# Patient Record
Sex: Male | Born: 1970 | Race: White | Hispanic: No | Marital: Single | Smoking: Never smoker
Health system: Southern US, Community
[De-identification: ages and names within clinical notes are randomized; demographics above are authoritative.]

## PROBLEM LIST (undated history)

## (undated) DIAGNOSIS — J479 Bronchiectasis, uncomplicated: Secondary | ICD-10-CM

## (undated) DIAGNOSIS — C6290 Malignant neoplasm of unspecified testis, unspecified whether descended or undescended: Secondary | ICD-10-CM

## (undated) HISTORY — DX: Bronchiectasis, uncomplicated: J47.9

## (undated) HISTORY — DX: Malignant neoplasm of unspecified testis, unspecified whether descended or undescended: C62.90

## (undated) HISTORY — PX: NASAL SINUS SURGERY: SHX719

---

## 2012-10-19 ENCOUNTER — Encounter: Payer: Self-pay | Admitting: Internal Medicine

## 2012-10-19 ENCOUNTER — Ambulatory Visit (INDEPENDENT_AMBULATORY_CARE_PROVIDER_SITE_OTHER): Payer: Managed Care, Other (non HMO) | Admitting: Internal Medicine

## 2012-10-19 VITALS — BP 118/70 | HR 62 | Temp 98.2°F | Ht 67.0 in | Wt 163.0 lb

## 2012-10-19 DIAGNOSIS — J479 Bronchiectasis, uncomplicated: Secondary | ICD-10-CM

## 2012-10-19 MED ORDER — LEVOFLOXACIN 750 MG PO TABS: 750.0000 mg | ORAL_TABLET | Freq: Every day | ORAL | Status: DC

## 2012-10-19 NOTE — Assessment & Plan Note (Addendum)
Presumably related to previous infections but no records available yet from [place]  Reviewed with pt Bronchiectasis =   you have scarring of your bronchial tubes which means that they don't function perfectly normally and mucus tends to pool in certain areas of your lung which can cause pneumonia and further scarring of your lung and bronchial tubes  Whenever you develop cough congestion take mucinex or mucinex dm > these will help keep the mucus loose and flowing but if your condition worsens you need to seek help immediately preferably here or somewhere inside the [facility] system to compare xrays ( worse = darker or bloody mucus or pain on breathing in)  - for now no reason to change rx but ok to use mucinex prn and stop hypertonic saline as not available in [place] anyway (pt has already searched and we don't typically use if for bronchiectasis).  Needs baseline pft's and cxr on return and review of records when available.

## 2012-10-19 NOTE — Progress Notes (Signed)
  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  10/19/2012 1st pulmonary eval cc cough each am > minimal clear never bloody on zmax 500 tiw and pulmicort /xopenex bid and singulair and no limiting sob at time of initial ov.  No obvious daytime variabilty or assoc   cp or chest tightness, subjective wheeze overt sinus or hb symptoms. No unusual exp hx or h/o childhood pna/ asthma or premature birth to his knowledge.   Sleeping ok without nocturnal  or early am exacerbation  of respiratory  c/o's or need for noct saba. Also denies any obvious fluctuation of symptoms with weather or environmental changes or other aggravating or alleviating factors except as outlined above     Review of Systems  Constitutional: Negative for fever, chills, activity change, appetite change and unexpected weight change.  HENT: Negative for congestion, sore throat, rhinorrhea, sneezing, trouble swallowing, dental problem, voice change and postnasal drip.   Eyes: Negative for visual disturbance.  Respiratory: Positive for cough. Negative for choking and shortness of breath.   Cardiovascular: Negative for chest pain and leg swelling.  Gastrointestinal: Positive for abdominal pain. Negative for nausea and vomiting.  Genitourinary: Negative for difficulty urinating.  Musculoskeletal: Negative for arthralgias.  Skin: Negative for rash.  Psychiatric/Behavioral: Negative for behavioral problems and confusion.       Objective:   Physical Exam  amb wm nad Wt Readings from Last 3 Encounters:  10/19/12 163 lb (73.936 kg)   HEENT: nl dentition, turbinates, and orophanx. Nl external ear canals without  cough reflex   NECK :  without JVD/Nodes/TM/ nl carotid upstrokes bilaterally   LUNGS: no acc muscle use, clear to A and P bilaterally without cough on insp or exp maneuvers   CV:  RRR  no s3 or murmur or increase in P2, no edema   ABD:  soft and nontender with nl excursion in the supine position. No bruits or organomegaly, bowel sounds nl  MS:  warm without deformities, calf tenderness, cyanosis or clubbing  SKIN: warm and dry without lesions    NEURO:  alert, approp, no deficits        Assessment & Plan:

## 2012-10-19 NOTE — Patient Instructions (Addendum)
Continue nebulizer twice daily for now with budesonide and xopenex  Stop saline for now  Use proaire as needed if short of breath up to 4 hours but the less you use it the better it will work.  Levaquin 750 x 5 days if needed if mucus gets nasty  Please schedule a follow up visit in 3 months but call sooner if needed

## 2012-11-01 ENCOUNTER — Telehealth: Payer: Self-pay | Admitting: Internal Medicine

## 2012-11-01 MED ORDER — MOMETASONE FUROATE 50 MCG/ACT NA SUSP: 2.0000 | Freq: Two times a day (BID) | NASAL | Status: DC

## 2012-11-01 NOTE — Telephone Encounter (Signed)
Ok with me x 11 refills

## 2012-11-01 NOTE — Telephone Encounter (Signed)
Pt aware rx called in. Nothing further was needed

## 2012-11-01 NOTE — Telephone Encounter (Signed)
lmomtcb x1 

## 2012-11-01 NOTE — Telephone Encounter (Signed)
Pt returned call and can be reached @ [phone]. Chad Osborne

## 2012-11-01 NOTE — Telephone Encounter (Signed)
Pt requesting a refill on a nasal spray that was originally rxd by [employer] in [place]. Would like to have MW rx this for him so that he may continue to take it. Nasonex 2 spray each nostril BID   (90 day supply)  [facility] [address]  Please advise MW. Thanks.

## 2013-01-18 ENCOUNTER — Ambulatory Visit (INDEPENDENT_AMBULATORY_CARE_PROVIDER_SITE_OTHER)
Admission: RE | Admit: 2013-01-18 | Discharge: 2013-01-18 | Disposition: A | Discharge status: Home / Self Care | Payer: Managed Care, Other (non HMO) | Source: Ambulatory Visit | Attending: Internal Medicine | Admitting: Internal Medicine

## 2013-01-18 ENCOUNTER — Ambulatory Visit (INDEPENDENT_AMBULATORY_CARE_PROVIDER_SITE_OTHER): Payer: Managed Care, Other (non HMO) | Admitting: Internal Medicine

## 2013-01-18 ENCOUNTER — Encounter: Payer: Self-pay | Admitting: Internal Medicine

## 2013-01-18 VITALS — BP 110/72 | HR 60 | Temp 98.4°F | Ht 68.0 in | Wt 165.0 lb

## 2013-01-18 DIAGNOSIS — J479 Bronchiectasis, uncomplicated: Secondary | ICD-10-CM

## 2013-01-18 NOTE — Patient Instructions (Addendum)
Please remember to go to the  x-ray department downstairs for your tests - we will call you with the results when they are available.  Find out when your pneumonia shot was   Please schedule a follow up visit in 3 months but call sooner if needed with PFT's

## 2013-01-18 NOTE — Assessment & Plan Note (Signed)
I had an extended discussion with the patient today lasting 15 to 20 minutes of a 25 minute visit on the following issues:  Doing fine with a very modest treatment plan which includes prn levaquin and mucinex  He had a flutter valve ordered in [place] but never purchased it - will also consider adding more aggressive bronchodilator regimen when returns in 3 months for pfts but for now will stick with the less is more approach and not overtreat what will in all likelihood persist as a lifelong condition  Needs to know when next pneumovax is due, he will check with [place].

## 2013-01-18 NOTE — Progress Notes (Signed)
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   HPI 10/19/2012 1st pulmonary eval cc cough each am > minimal clear never bloody on zmax 500 tiw and pulmicort /xopenex bid and singulair and no limiting sob at time of initial ov. rec Continue nebulizer twice daily for now with budesonide and xopenex Stop saline for now Use proaire as needed if short of breath up to 4 hours but the less you use it the better it will work. Levaquin 750 x 5 days if needed if mucus gets nasty   01/18/2013 f/u ov/Ein Rijo  Chief Complaint  Patient presents with  . 3 Month Follow Up    Breathing is doing well. Reports having to get Levaquin filled. Has been running and excersing more than he has in a while.   one flare of cough with discolored sputum since last ov, resolved p 5 days levaquin and no need for even mucinex at this point.  Very rare need for saba hfa only , never neb  No obvious daytime variabilty or assoc   cp or chest tightness, subjective wheeze overt sinus or hb symptoms. No unusual exp hx or h/o childhood pna/ asthma or premature birth to his knowledge.   Sleeping ok without nocturnal  or early am exacerbation  of respiratory  c/o's or need for noct saba. Also denies any obvious fluctuation of symptoms with weather or environmental changes or other aggravating or alleviating factors except as outlined above    Current Medications, Allergies, Past Medical History, Past Surgical History, Family History, and Social History were reviewed in [employer] record.  ROS  The following are not active complaints unless bolded sore throat,  dysphagia, dental problems, itching, sneezing,  nasal congestion or excess/ purulent secretions, ear ache,   fever, chills, sweats, unintended wt loss, pleuritic or exertional cp, hemoptysis,  orthopnea pnd or leg swelling, presyncope, palpitations, heartburn, abdominal pain, anorexia, nausea, vomiting, diarrhea  or change in bowel or urinary habits, change in stools or urine, dysuria,hematuria,  rash, arthralgias, visual complaints, headache, numbness weakness or ataxia or problems with walking or coordination,  change in mood/affect or memory.             Objective:   Physical Exam  amb wm nad  01/18/2013          165  Wt Readings from Last 3 Encounters:  10/19/12 163 lb (73.936 kg)     HEENT: nl dentition, turbinates, and orophanx. Nl external ear canals without cough reflex   NECK :  without JVD/Nodes/TM/ nl carotid upstrokes bilaterally   LUNGS: no acc muscle use, clear to A and P bilaterally without cough on insp or exp maneuvers   CV:  RRR  no s3 or murmur or increase in P2, no edema   ABD:  soft and nontender with nl excursion in the supine position. No bruits or organomegaly, bowel sounds nl  MS:  warm without deformities, calf tenderness, cyanosis or clubbing  SKIN: warm and dry without lesions    NEURO:  alert, approp, no deficits   CXR  01/18/2013 :   There is a degree  of right lower lobe bronchiectasis. No other bronchiectasis seen My review: tram tracking probably present both lower lobes       Assessment & Plan:

## 2013-01-31 ENCOUNTER — Telehealth: Payer: Self-pay | Admitting: Internal Medicine

## 2013-01-31 NOTE — Telephone Encounter (Signed)
The intent is to use the levaquin x 5 days for flares of discolored mucus, that's why he was given 11 refills.  If he takes it and it doesn't work to change the mucus back to white, then we need to see him to try something different, but as long as it works then he should stick with with short courses

## 2013-01-31 NOTE — Telephone Encounter (Signed)
Spoke with patient-- Patient seen on 10/19/12 and given Levaquin 750 mg #5 w 11 refills for bronchiectasis sx Patient states he was feeling a lot better when he returned for follow up visit 01/18/13 However in the last week patient says his sx have returned Patient is planning on going out of town in which he will spend quite a bit of time outdoors and states he "simply cant feel like this on vacation" Patient would like to know if Dr. Sherene Sires thinks he should pick up another refill of Levaquin or is there something else he could rec for patient Dr. Sherene Sires please advise, thank you!  No Known Allergies

## 2013-01-31 NOTE — Telephone Encounter (Signed)
Pt is aware of MW recs. He states that he is just not feeling well, wants to be seen before the end of the week due to a vacation. Appointment has been made with TP on 02/02/13 at 10:45a, MW did not have anything this week.

## 2013-01-31 NOTE — Telephone Encounter (Signed)
[phone] pt is calling back

## 2013-02-02 ENCOUNTER — Encounter: Payer: Self-pay | Admitting: Adult Health

## 2013-02-02 ENCOUNTER — Ambulatory Visit (INDEPENDENT_AMBULATORY_CARE_PROVIDER_SITE_OTHER): Payer: Managed Care, Other (non HMO) | Admitting: Adult Health

## 2013-02-02 VITALS — BP 118/72 | HR 62 | Temp 97.2°F | Ht 68.0 in | Wt 168.2 lb

## 2013-02-02 DIAGNOSIS — J471 Bronchiectasis with (acute) exacerbation: Secondary | ICD-10-CM

## 2013-02-02 MED ORDER — LEVOFLOXACIN 500 MG PO TABS: 500.0000 mg | ORAL_TABLET | Freq: Every day | ORAL | Status: DC

## 2013-02-02 NOTE — Assessment & Plan Note (Signed)
Slow to resolve flare  Add flutter to regimen.  Trial of probiotic with frequent abx use.   Plan  Levaquin 500mg  daily for 10 days -take with food Hold Zithromax until done with Levaquin .  Would take probiotic daily .  Mucinex  Twice daily  As needed  Cough/congestion .  Saline nasal rinses Twice daily  As needed   Please contact office for sooner follow up if symptoms do not improve or worsen or seek emergency care  If not improving will need to follow up sooner.

## 2013-02-02 NOTE — Patient Instructions (Addendum)
Levaquin 500mg  daily for 10 days -take with food Hold Zithromax until done with Levaquin .  Would take probiotic daily .  Mucinex  Twice daily  As needed  Cough/congestion .  Saline nasal rinses Twice daily  As needed   Please contact office for sooner follow up if symptoms do not improve or worsen or seek emergency care  If not improving will need to follow up sooner.

## 2013-02-02 NOTE — Progress Notes (Signed)
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   HPI 10/19/2012 1st pulmonary eval cc cough each am > minimal clear never bloody on zmax 500 tiw and pulmicort /xopenex bid and singulair and no limiting sob at time of initial ov. rec Continue nebulizer twice daily for now with budesonide and xopenex Stop saline for now Use proaire as needed if short of breath up to 4 hours but the less you use it the better it will work. Levaquin 750 x 5 days if needed if mucus gets nasty   01/18/2013 f/u ov/[place]  Chief Complaint  Patient presents with  . 3 Month Follow Up    Breathing is doing well. Reports having to get Levaquin filled. Has been running and excersing more than he has in a while.   one flare of cough with discolored sputum since last ov, resolved p 5 days levaquin and no need for even mucinex at this point.  Very rare need for saba hfa only , never neb >>CXR with bronchiectasis changes in bases   02/02/2013 Acute OV  Complains of increased SOB, tickle in back of throat, prod cough with thick light yellow mucus x10days.  refilled Levaquin 750mg  1 week ago with no relief-finished 3 days ago. Does have chronic sinus dz with previous sinus surgery.  Has sinus headache/tenderness, pressure and yellow discharge.  Leaving to go on vacation to [place] tomorrow.  No hemoptysis, wt loss, chest pain, orthopnea or edema.  No diarrhea or bloody stools.  Has coughing fits on/off.  Does not have a flutter valve.  Using budesonide and xopenex nebs .  On Zithromax 3 days a week. Has not taken today.    Current Medications, Allergies, Past Medical History, Past  Surgical History, Family History, and Social History were reviewed in [employer] record.  ROS  The following are not active complaints unless bolded sore throat, dysphagia, dental problems, itching, sneezing,  nasal congestion or excess/ purulent secretions, ear ache,   fever, chills, sweats, unintended wt loss, pleuritic or exertional cp, hemoptysis,  orthopnea pnd or leg swelling, presyncope, palpitations, heartburn, abdominal pain, anorexia, nausea, vomiting, diarrhea  or change in bowel or urinary habits, change in stools or urine, dysuria,hematuria,  rash, arthralgias, visual complaints, headache, numbness weakness or ataxia or problems with walking or coordination,  change in mood/affect or memory.             Objective:   Physical Exam  amb wm nad  01/18/2013          165> 168 02/02/2013     HEENT: nl dentition, right sided maxillary tenderness and turbinate edema , and orophanx. Nl external ear canals without cough reflex,    NECK :  without JVD/Nodes/TM/ nl carotid upstrokes bilaterally   LUNGS: no acc muscle use, clear to A and P bilaterally without cough on insp or exp maneuvers   CV:  RRR  no s3 or murmur or increase in P2, no edema   ABD:  soft and nontender with nl excursion in the supine position. No bruits or organomegaly, bowel sounds nl  MS:  warm without deformities,  calf tenderness, cyanosis or clubbing  SKIN: warm and dry without lesions    NEURO:  alert, approp, no deficits   CXR  01/18/2013 :  There is a degree of right lower lobe bronchiectasis. No other bronchiectasis seen My review: tram tracking probably present both lower lobes       Assessment & Plan:

## 2013-02-07 MED ORDER — FLUTTER DEVI: Status: AC

## 2013-02-07 NOTE — Addendum Note (Signed)
Addended by: Boone Master E on: 02/07/2013 10:47 AM   Modules accepted: Orders

## 2013-06-16 ENCOUNTER — Telehealth: Payer: Self-pay | Admitting: Internal Medicine

## 2013-06-16 ENCOUNTER — Encounter (INDEPENDENT_AMBULATORY_CARE_PROVIDER_SITE_OTHER): Payer: Self-pay

## 2013-06-16 ENCOUNTER — Ambulatory Visit (INDEPENDENT_AMBULATORY_CARE_PROVIDER_SITE_OTHER): Payer: Managed Care, Other (non HMO) | Admitting: Internal Medicine

## 2013-06-16 ENCOUNTER — Encounter: Payer: Self-pay | Admitting: Internal Medicine

## 2013-06-16 VITALS — BP 122/80 | HR 92 | Temp 98.0°F | Ht 67.5 in | Wt 170.8 lb

## 2013-06-16 DIAGNOSIS — J479 Bronchiectasis, uncomplicated: Secondary | ICD-10-CM

## 2013-06-16 DIAGNOSIS — Z23 Encounter for immunization: Secondary | ICD-10-CM

## 2013-06-16 MED ORDER — MOMETASONE FURO-FORMOTEROL FUM 100-5 MCG/ACT IN AERO: INHALATION_SPRAY | RESPIRATORY_TRACT | Status: DC

## 2013-06-16 MED ORDER — LEVOFLOXACIN 500 MG PO TABS: 500.0000 mg | ORAL_TABLET | Freq: Every day | ORAL | Status: DC

## 2013-06-16 NOTE — Progress Notes (Signed)
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   HPI 10/19/2012 1st pulmonary eval cc cough each am > minimal clear never bloody on zmax 500 tiw and pulmicort /xopenex bid and singulair and no limiting sob at time of initial ov. rec Continue nebulizer twice daily for now with budesonide and xopenex Stop saline for now Use proaire as needed if short of breath up to 4 hours but the less you use it the better it will work. Levaquin 750 x 5 days if needed if mucus gets nasty   01/18/2013 f/u ov/Opal Dinning  Chief Complaint  Patient presents with  . 3 Month Follow Up    Breathing is doing well. Reports having to get Levaquin filled. Has been running and excersing more than he has in a while.   one flare of cough with discolored sputum since last ov, resolved p 5 days levaquin and no need for even mucinex at this point.  Very rare need for saba hfa only , never neb >>CXR with bronchiectasis changes in bases   02/02/2013 Acute OV  Complains of increased SOB, tickle in back of throat, prod cough with thick light yellow mucus x10days.  refilled Levaquin 750mg  1 week ago with no relief-finished 3 days ago. Does have chronic sinus dz with previous sinus surgery.  Has sinus headache/tenderness, pressure and yellow discharge.  Leaving to go on vacation to [place] tomorrow.  No hemoptysis, wt loss, chest pain, orthopnea or edema.  No diarrhea or bloody stools.  Has coughing fits on/off.  Does not have a flutter valve.  Using budesonide and xopenex nebs .  On Zithromax 3 days a week  rec Levaquin 500mg  daily for 10 days -take with food Hold  Zithromax until done with Levaquin .  Would take probiotic daily .  Mucinex  Twice daily  As needed  Cough/congestion .  Saline nasal rinses Twice daily  As needed     06/16/2013 f/u ov/Buffi Ew[place] re: bronchiectasis f/u  Chief Complaint  Patient presents with  . Follow-up    Pt c/o increased SOB, prod cough with thick, yellow sputum x 2 wks- he is taking his second round of levaquin and syptoms seem to be improving.   had been using levaquin 500 x 10 days which he found more effective than 750 x 5 Using bud/form neb bid with prev problems with thrush with ics/laba per hfa  No obvious day to day or daytime variabilty or assoc  cp or chest tightness, subjective wheeze overt sinus or hb symptoms. No unusual exp hx or h/o childhood pna/ asthma or knowledge of premature birth.  Sleeping ok without nocturnal  or early am exacerbation  of respiratory  c/o's or need for noct saba. Also denies any obvious fluctuation of symptoms with weather or environmental changes or other aggravating or alleviating factors except as outlined above   Current Medications, Allergies, Complete Past Medical History, Past Surgical History, Family History, and Social History were reviewed in [employer] record.  ROS  The following are not active complaints unless bolded sore throat, dysphagia, dental problems, itching, sneezing,  nasal congestion  or excess/ purulent secretions, ear ache,   fever, chills, sweats, unintended wt loss, pleuritic or exertional cp, hemoptysis,  orthopnea pnd or leg swelling, presyncope, palpitations, heartburn, abdominal pain, anorexia, nausea, vomiting, diarrhea  or change in bowel or urinary habits, change in stools or urine, dysuria,hematuria,  rash, arthralgias, visual complaints, headache, numbness weakness or ataxia or problems with walking or coordination,  change in mood/affect or memory.                    Objective:   Physical Exam  amb wm nad  Wt  Readings from Last 3 Encounters:  06/16/13 170 lb 12.8 oz (77.474 kg)  02/02/13 168 lb 3.2 oz (76.295 kg)  01/18/13 165 lb (74.844 kg)        HEENT: nl dentition, no turbinate edema , and nl orophanx. Nl external ear canals without cough reflex,    NECK :  without JVD/Nodes/TM/ nl carotid upstrokes bilaterally   LUNGS: no acc muscle use, clear to A and P bilaterally without cough on insp or exp maneuvers   CV:  RRR  no s3 or murmur or increase in P2, no edema   ABD:  soft and nontender with nl excursion in the supine position. No bruits or organomegaly, bowel sounds nl  MS:  warm without deformities, calf tenderness, cyanosis or clubbing     CXR  01/18/2013 :  There is a degree of right lower lobe bronchiectasis. No other bronchiectasis seen My review: tram tracking probably present both lower lobes       Assessment & Plan:

## 2013-06-16 NOTE — Assessment & Plan Note (Addendum)
DDX of  difficult airways managment all start with A and  include Adherence, Ace Inhibitors, Acid Reflux, Active Sinus Disease, Alpha 1 Antitripsin deficiency, Anxiety masquerading as Airways dz,  ABPA,  allergy(esp in young), Aspiration (esp in elderly), Adverse effects of DPI,  Active smokers, plus two Bs  = Bronchiectasis and Beta blocker use..and one C= CHF  Adherence is always the initial "prime suspect" and is a multilayered concern that requires a "trust but verify" approach in every patient - starting with knowing how to use medications, especially inhalers, correctly, keeping up with refills and understanding the fundamental difference between maintenance and prns vs those medications only taken for a very short course and then stopped and not refilled.    Bronchiectasis discussed > needs prevnar/ ok to do 10 day cycles of levaquin  ? Allergy/ asthma > try dulera 100 2bid  - The proper method of use, as well as anticipated side effects, of a metered-dose inhaler are discussed and demonstrated to the patient. Improved effectiveness after extensive coaching during this visit to a level of approximately  90%   See instructions for specific recommendations which were reviewed directly with the patient who was given a copy with highlighter outlining the key components.   Needs f/u pfts

## 2013-06-16 NOTE — Addendum Note (Signed)
Addended by: Mathis Bud on: 06/16/2013 11:24 AM   Modules accepted: Orders

## 2013-06-16 NOTE — Telephone Encounter (Signed)
10 days is fine even if he has pna - the problem is one of interpretation given his bronchiectasis - since we have a comparison study from 01/2013 rec he finish the levaquin and return here for apples to apples cxr comparison 2 weeks p finishes levaquin but sooner if condition worsens p finishes levaquin

## 2013-06-16 NOTE — Telephone Encounter (Signed)
Called and spoke with pt. He reports on Monday his PCP rx'd levaquin. Pt reports 10 min ago he was called and advised he had PNA. Pt received prevanr today at [facility]. He has 3 days left of levaquin. He wants to make sure he is right on track with tx. Please advise MW thanks

## 2013-06-16 NOTE — Telephone Encounter (Signed)
Called and spoke with pt and made of recs. Nothing further needed and appt scheduled

## 2013-06-16 NOTE — Patient Instructions (Addendum)
Prevnar today   Dulera 100 Take 2 puffs first thing in am and then another 2 puffs about 12 hours later.   Only use your albuterol (proair) as a rescue medication to be used if you can't catch your breath by resting or doing a relaxed purse lip breathing pattern.  - The less you use it, the better it will work when you need it. - Ok to use up to 2 puffs  every 4 hours if you must but call for immediate appointment if use goes up over your usual need - Don't leave home without it !!  (think of it like the spare tire for your car)   Please schedule a follow up office visit in 6 weeks, call sooner if needed with pfts

## 2013-06-30 ENCOUNTER — Ambulatory Visit (INDEPENDENT_AMBULATORY_CARE_PROVIDER_SITE_OTHER): Payer: Managed Care, Other (non HMO) | Admitting: Internal Medicine

## 2013-06-30 ENCOUNTER — Encounter: Payer: Self-pay | Admitting: Internal Medicine

## 2013-06-30 ENCOUNTER — Ambulatory Visit (INDEPENDENT_AMBULATORY_CARE_PROVIDER_SITE_OTHER)
Admission: RE | Admit: 2013-06-30 | Discharge: 2013-06-30 | Disposition: A | Discharge status: Home / Self Care | Payer: Managed Care, Other (non HMO) | Source: Ambulatory Visit | Attending: Internal Medicine | Admitting: Internal Medicine

## 2013-06-30 ENCOUNTER — Other Ambulatory Visit: Payer: Managed Care, Other (non HMO)

## 2013-06-30 VITALS — BP 128/70 | HR 85 | Temp 98.2°F | Ht 68.0 in | Wt 166.2 lb

## 2013-06-30 DIAGNOSIS — J479 Bronchiectasis, uncomplicated: Secondary | ICD-10-CM

## 2013-06-30 NOTE — Progress Notes (Signed)
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   HPI 10/19/2012 1st pulmonary eval cc cough each am > minimal clear never bloody on zmax 500 tiw and pulmicort /xopenex bid and singulair and no limiting sob at time of initial ov. rec Continue nebulizer twice daily for now with budesonide and xopenex Stop saline for now Use proaire as needed if short of breath up to 4 hours but the less you use it the better it will work. Levaquin 750 x 5 days if needed if mucus gets nasty   01/18/2013 f/u ov/Jensen Cheramie  Chief Complaint  Patient presents with  . 3 Month Follow Up    Breathing is doing well. Reports having to get Levaquin filled. Has been running and excersing more than he has in a while.   one flare of cough with discolored sputum since last ov, resolved p 5 days levaquin and no need for even mucinex at this point.  Very rare need for saba hfa only , never neb >>CXR with bronchiectasis changes in bases   02/02/2013 Acute OV  Complains of increased SOB, tickle in back of throat, prod cough with thick light yellow mucus x10days.  refilled Levaquin 750mg  1 week ago with no relief-finished 3 days ago. Does have chronic sinus dz with previous sinus surgery.  Has sinus headache/tenderness, pressure and yellow discharge.  Leaving to go on vacation to [place] tomorrow.  No hemoptysis, wt loss, chest pain, orthopnea or edema.  No diarrhea or bloody stools.  Has coughing fits on/off.  Does not have a flutter valve.  Using budesonide and xopenex nebs .  On Zithromax 3 days a week  rec Levaquin 500mg  daily for 10 days -take with food Hold  Zithromax until done with Levaquin .  Would take probiotic daily .  Mucinex  Twice daily  As needed  Cough/congestion .  Saline nasal rinses Twice daily  As needed     06/16/2013 f/u ov/Yazeed Pryer re: bronchiectasis f/u  Chief Complaint  Patient presents with  . Follow-up    Pt c/o increased SOB, prod cough with thick, yellow sputum x 2 wks- he is taking his second round of levaquin and syptoms seem to be improving.   had been using levaquin 500 x 10 days which he found more effective than 750 x 5 Using bud/form neb bid with prev problems with thrush with ics/laba per hfa rec Prevnar rx  Dulera 100 Take 2 puffs first thing in am and then another 2 puffs about 12 hours later.  Only use your albuterol (proair) as rescue    06/30/2013 f/u ov/Shalisa Mcquade re: bronchiectasis/ ? sinsusitis Chief Complaint  Patient presents with  . Follow-up    Pt states that his cough and SOB is improved since the last visit. He c/o having some sinus pressure and HA since last visit.      Much less need for saba   No obvious day to day or daytime variabilty or assoc  cp or chest tightness, subjective wheeze overt sinus or hb symptoms. No unusual exp hx or h/o childhood pna/ asthma or knowledge of premature birth.  Sleeping ok without nocturnal  or early am exacerbation  of respiratory  c/o's or need for noct saba. Also denies any obvious fluctuation of symptoms with weather or environmental changes or other aggravating or alleviating factors except as outlined above   Current Medications, Allergies, Complete Past Medical History, Past Surgical History, Family History, and Social History were reviewed in [employer] record.  ROS  The following are not active complaints unless bolded sore throat, dysphagia, dental problems, itching, sneezing,  nasal congestion or excess/ purulent secretions, ear ache,   fever, chills, sweats, unintended wt loss, pleuritic or exertional cp, hemoptysis,  orthopnea  pnd or leg swelling, presyncope, palpitations, heartburn, abdominal pain, anorexia, nausea, vomiting, diarrhea  or change in bowel or urinary habits, change in stools or urine, dysuria,hematuria,  rash, arthralgias, visual complaints, headache, numbness weakness or ataxia or problems with walking or coordination,  change in mood/affect or memory.                    Objective:   Physical Exam  amb wm nad  06/30/2013      166 Wt Readings from Last 3 Encounters:  06/16/13 170 lb 12.8 oz (77.474 kg)  02/02/13 168 lb 3.2 oz (76.295 kg)  01/18/13 165 lb (74.844 kg)        HEENT: nl dentition, no turbinate edema , and nl orophanx. Nl external ear canals without cough reflex,    NECK :  without JVD/Nodes/TM/ nl carotid upstrokes bilaterally   LUNGS: no acc muscle use,  Bilateral mid exp bilateral rhonchi   CV:  RRR  no s3 or murmur or increase in P2, no edema   ABD:  soft and nontender with nl excursion in the supine position. No bruits or organomegaly, bowel sounds nl  MS:  warm without deformities, calf tenderness, cyanosis or clubbing     CXR  06/30/2013 : The heart size and mediastinal contours are within normal limits.  Both lungs are clear. No pleural effusion or pneumothorax is noted.  The visualized skeletal structures are unremarkable.  IMPRESSION:  No active cardiopulmonary disease.       Assessment & Plan:

## 2013-06-30 NOTE — Patient Instructions (Signed)
Please see patient coordinator before you leave today  to schedule sinus CT  I emphasized that nasal steroids have no immediate benefit in terms of improving symptoms.  To help them reached the target tissue, the patient should use Afrin two puffs every 12 hours applied one min before using the nasal steroids.  Afrin should be stopped after no more than 5 days.  If the symptoms worsen, Afrin can be restarted after 5 days off of therapy to prevent rebound congestion from overuse of Afrin.  I also emphasized that in no way are nasal steroids a concern in terms of "addiction".   Please remember to go to the lab   department downstairs for your tests - we will call you with the results when they are available.      Reschedule your visit for pft's

## 2013-06-30 NOTE — Assessment & Plan Note (Addendum)
-   Flutter valve 02/02/13  - Prevnar given 06/16/2013  - started dulera 100 2bid 06/16/2013 > -  Alpha one AT 06/30/2013 >>> - PFT's rec for 07/2013 >>>  Adequate control on present rx, reviewed > no change in rx needed  But does need pfts and sinus ct to complete the w/u plus alpha one genotype   Each maintenance medication was reviewed in detail including most importantly the difference between maintenance and as needed and under what circumstances the prns are to be used.  Please see instructions for details which were reviewed in writing and the patient given a copy.

## 2013-07-02 LAB — ALPHA-1-ANTITRYPSIN: A-1 Antitrypsin, Ser: 146 mg/dL (ref 90–200)

## 2013-07-07 ENCOUNTER — Encounter: Payer: Self-pay | Admitting: Internal Medicine

## 2013-07-07 ENCOUNTER — Ambulatory Visit (INDEPENDENT_AMBULATORY_CARE_PROVIDER_SITE_OTHER)
Admission: RE | Admit: 2013-07-07 | Discharge: 2013-07-07 | Disposition: A | Discharge status: Home / Self Care | Payer: Managed Care, Other (non HMO) | Source: Ambulatory Visit | Attending: Internal Medicine | Admitting: Internal Medicine

## 2013-07-07 DIAGNOSIS — J479 Bronchiectasis, uncomplicated: Secondary | ICD-10-CM

## 2013-07-07 LAB — ALPHA-1 ANTITRYPSIN PHENOTYPE: A-1 Antitrypsin: 131 mg/dL (ref 83–199)

## 2013-07-08 ENCOUNTER — Telehealth: Payer: Self-pay | Admitting: Internal Medicine

## 2013-07-08 ENCOUNTER — Encounter: Payer: Self-pay | Admitting: Internal Medicine

## 2013-07-08 MED ORDER — PREDNISONE 10 MG PO TABS: ORAL_TABLET | ORAL | Status: DC

## 2013-07-08 MED ORDER — AMOXICILLIN-POT CLAVULANATE 875-125 MG PO TABS: 1.0000 | ORAL_TABLET | Freq: Two times a day (BID) | ORAL | Status: DC

## 2013-07-08 NOTE — Progress Notes (Signed)
Quick Note:  Spoke with pt and notified of results per Dr. Wert. Pt verbalized understanding and denied any questions.  ______ 

## 2013-07-08 NOTE — Telephone Encounter (Signed)
Augmentin 875 mg take one pill twice daily  X 10 days - take at breakfast and supper with large glass of water.  It would help reduce the usual side effects (diarrhea and yeast infections) if you ate cultured yogurt at lunch.   Prednisone 10 mg take  4 each am x 2 days,   2 each am x 2 days,  1 each am x 2 days and stop  Lots of NS irrigation > to ent if not responding

## 2013-07-08 NOTE — Telephone Encounter (Signed)
Spoke with the pt and notified of his CT Sinus results  He reports having sinus pressure and HA's daily  He wants to know if there is anything else that he can try  Afrin helped x 5 days and will probably wait a couple more days and take this again  He is taking aleve cold and sinus  Please advise thanks!

## 2013-07-08 NOTE — Telephone Encounter (Signed)
Pt aware of recs, RX has been called in

## 2013-07-29 ENCOUNTER — Ambulatory Visit: Payer: Managed Care, Other (non HMO) | Admitting: Internal Medicine

## 2013-08-04 ENCOUNTER — Other Ambulatory Visit: Payer: Self-pay | Admitting: Internal Medicine

## 2013-08-04 DIAGNOSIS — R0602 Shortness of breath: Secondary | ICD-10-CM

## 2013-08-05 ENCOUNTER — Encounter: Payer: Self-pay | Admitting: Internal Medicine

## 2013-08-05 ENCOUNTER — Ambulatory Visit (INDEPENDENT_AMBULATORY_CARE_PROVIDER_SITE_OTHER): Payer: Managed Care, Other (non HMO) | Admitting: Internal Medicine

## 2013-08-05 VITALS — BP 140/90 | HR 85 | Ht 67.0 in | Wt 168.0 lb

## 2013-08-05 DIAGNOSIS — R0602 Shortness of breath: Secondary | ICD-10-CM

## 2013-08-05 DIAGNOSIS — J479 Bronchiectasis, uncomplicated: Secondary | ICD-10-CM

## 2013-08-05 NOTE — Progress Notes (Signed)
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   HPI 10/19/2012 1st pulmonary eval cc cough each am > minimal clear never bloody on zmax 500 tiw and pulmicort /xopenex bid and singulair and no limiting sob at time of initial ov. rec Continue nebulizer twice daily for now with budesonide and xopenex Stop saline for now Use proaire as needed if short of breath up to 4 hours but the less you use it the better it will work. Levaquin 750 x 5 days if needed if mucus gets nasty   01/18/2013 f/u ov/Ardis Lawley  Chief Complaint  Patient presents with  . 3 Month Follow Up    Breathing is doing well. Reports having to get Levaquin filled. Has been running and excersing more than he has in a while.   one flare of cough with discolored sputum since last ov, resolved p 5 days levaquin and no need for even mucinex at this point.  Very rare need for saba hfa only , never neb >>CXR with bronchiectasis changes in bases   02/02/2013 Acute OV  Complains of increased SOB, tickle in back of throat, prod cough with thick light yellow mucus x10days.  refilled Levaquin 750mg  1 week ago with no relief-finished 3 days ago. Does have chronic sinus dz with previous sinus surgery.  Has sinus headache/tenderness, pressure and yellow discharge.  Leaving to go on vacation to [place] tomorrow.  No hemoptysis, wt loss, chest pain, orthopnea or edema.  No diarrhea or bloody stools.  Has coughing fits on/off.  Does not have a flutter valve.  Using budesonide and xopenex nebs .  On Zithromax 3 days a week  rec Levaquin 500mg  daily for 10 days -take with food Hold  Zithromax until done with Levaquin .  Would take probiotic daily .  Mucinex  Twice daily  As needed  Cough/congestion .  Saline nasal rinses Twice daily  As needed     06/16/2013 f/u ov/Amos Micheals re: bronchiectasis f/u  Chief Complaint  Patient presents with  . Follow-up    Pt c/o increased SOB, prod cough with thick, yellow sputum x 2 wks- he is taking his second round of levaquin and syptoms seem to be improving.   had been using levaquin 500 x 10 days which he found more effective than 750 x 5 Using bud/form neb bid with prev problems with thrush with ics/laba per hfa rec Prevnar rx  Dulera 100 Take 2 puffs first thing in am and then another 2 puffs about 12 hours later.  Only use your albuterol (proair) as rescue    06/30/2013 f/u ov/Lindsie Simar re: bronchiectasis/ ? sinsusitis Chief Complaint  Patient presents with  . Follow-up    Pt states that his cough and SOB is improved since the last visit. He c/o having some sinus pressure and HA since last visit.   Much less need for saba rec  schedule sinus CT> chronic changes only rx nasal steroids   08/05/2013 f/u ov/Adasyn Mcadams re: bronchiectasis  Chief Complaint  Patient presents with  . Follow-up    PFT done today-- [facility] working well and no complaints with breatihng  no need for rescue at all, Not limited by breathing from desired activities   Stopped zmax sev weeks prior to OV  No change symptoms   No obvious day to day or daytime variabilty or assoc cough or  cp or chest tightness, subjective wheeze overt sinus or hb symptoms. No unusual exp hx or h/o childhood pna/ asthma or knowledge of premature birth.  Sleeping ok without nocturnal  or early am exacerbation  of respiratory  c/o's or need for noct saba. Also denies any obvious fluctuation of symptoms with weather or environmental changes or other aggravating or alleviating factors except as outlined above   Current Medications, Allergies, Complete Past Medical History, Past Surgical  History, Family History, and Social History were reviewed in [employer] record.  ROS  The following are not active complaints unless bolded sore throat, dysphagia, dental problems, itching, sneezing,  nasal congestion or excess/ purulent secretions, ear ache,   fever, chills, sweats, unintended wt loss, pleuritic or exertional cp, hemoptysis,  orthopnea pnd or leg swelling, presyncope, palpitations, heartburn, abdominal pain, anorexia, nausea, vomiting, diarrhea  or change in bowel or urinary habits, change in stools or urine, dysuria,hematuria,  rash, arthralgias, visual complaints, headache, numbness weakness or ataxia or problems with walking or coordination,  change in mood/affect or memory.                    Objective:   Physical Exam  amb wm nad  06/30/2013      166>  08/05/13  Wt Readings from Last 3 Encounters:  06/16/13 170 lb 12.8 oz (77.474 kg)  02/02/13 168 lb 3.2 oz (76.295 kg)  01/18/13 165 lb (74.844 kg)        HEENT: nl dentition, no turbinate edema , and nl orophanx. Nl external ear canals without cough reflex,    NECK :  without JVD/Nodes/TM/ nl carotid upstrokes bilaterally   LUNGS: no acc muscle use,  Very min exp bilateral rhonchi   CV:  RRR  no s3 or murmur or increase in P2, no edema   ABD:  soft and nontender with nl excursion in the supine position. No bruits or organomegaly, bowel sounds nl  MS:  warm without deformities, calf tenderness, cyanosis or clubbing     CXR  06/30/2013 : The heart size and mediastinal contours are within normal limits.  Both lungs are clear. No pleural effusion or pneumothorax is noted.  The visualized skeletal structures are unremarkable.  IMPRESSION:  No active cardiopulmonary disease.       Assessment & Plan:

## 2013-08-05 NOTE — Patient Instructions (Addendum)
No change in meds or medication routine (except leave off zmax)  Please schedule a follow up visit in 6 months but call sooner if needed

## 2013-08-05 NOTE — Progress Notes (Signed)
PFT done today. 

## 2013-08-05 NOTE — Assessment & Plan Note (Addendum)
-   Flutter valve 02/02/13  - Prevnar given 06/16/2013  - started dulera 100 2bid 06/16/2013 > -  Alpha one AT 06/30/2013 >  MM - Sinus CT rec 06/30/2013 >  Diffuse chronic sinusitis changes  - PFT's 08/05/2013  wnl   Adequate control on present rx, reviewed > no change in rx needed

## 2013-08-19 ENCOUNTER — Other Ambulatory Visit: Payer: Self-pay | Admitting: Internal Medicine

## 2013-09-21 ENCOUNTER — Encounter: Payer: Self-pay | Admitting: Internal Medicine

## 2013-09-21 ENCOUNTER — Ambulatory Visit (INDEPENDENT_AMBULATORY_CARE_PROVIDER_SITE_OTHER): Payer: Managed Care, Other (non HMO) | Admitting: Internal Medicine

## 2013-09-21 ENCOUNTER — Other Ambulatory Visit: Payer: Self-pay | Admitting: Internal Medicine

## 2013-09-21 ENCOUNTER — Ambulatory Visit (INDEPENDENT_AMBULATORY_CARE_PROVIDER_SITE_OTHER)
Admission: RE | Admit: 2013-09-21 | Discharge: 2013-09-21 | Disposition: A | Discharge status: Home / Self Care | Payer: Managed Care, Other (non HMO) | Source: Ambulatory Visit | Attending: Internal Medicine | Admitting: Internal Medicine

## 2013-09-21 VITALS — BP 122/70 | HR 92 | Temp 97.9°F | Ht 67.0 in | Wt 159.0 lb

## 2013-09-21 DIAGNOSIS — J479 Bronchiectasis, uncomplicated: Secondary | ICD-10-CM

## 2013-09-21 DIAGNOSIS — J471 Bronchiectasis with (acute) exacerbation: Secondary | ICD-10-CM

## 2013-09-21 MED ORDER — AMOXICILLIN-POT CLAVULANATE 875-125 MG PO TABS: 1.0000 | ORAL_TABLET | Freq: Two times a day (BID) | ORAL | Status: DC

## 2013-09-21 MED ORDER — AZITHROMYCIN 250 MG PO TABS: ORAL_TABLET | ORAL | Status: DC

## 2013-09-21 MED ORDER — AZITHROMYCIN 500 MG PO TABS: ORAL_TABLET | ORAL | Status: DC

## 2013-09-21 MED ORDER — PREDNISONE 10 MG PO TABS: ORAL_TABLET | ORAL | Status: DC

## 2013-09-21 NOTE — Patient Instructions (Addendum)
zpak Prednisone 10 mg take  4 each am x 2 days,   2 each am x 2 days,  1 each am x 2 days and stop   Once finish zpak, assuming better then start back on zmax 500 three times a week  Please remember to go to the  x-ray department downstairs for your tests - we will call you with the results when they are available.

## 2013-09-21 NOTE — Progress Notes (Signed)
Quick Note:  Spoke with pt and notified of results per Dr. Wert. Pt verbalized understanding and denied any questions.  ______ 

## 2013-09-21 NOTE — Progress Notes (Signed)
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   HPI 10/19/2012 1st pulmonary eval cc cough each am > minimal clear never bloody on zmax 500 tiw and pulmicort /xopenex bid and singulair and no limiting sob at time of initial ov. rec Continue nebulizer twice daily for now with budesonide and xopenex Stop saline for now Use proaire as needed if short of breath up to 4 hours but the less you use it the better it will work. Levaquin 750 x 5 days if needed if mucus gets nasty   01/18/2013 f/u ov/Aveline Daus  Chief Complaint  Patient presents with  . 3 Month Follow Up    Breathing is doing well. Reports having to get Levaquin filled. Has been running and excersing more than he has in a while.   one flare of cough with discolored sputum since last ov, resolved p 5 days levaquin and no need for even mucinex at this point.  Very rare need for saba hfa only , never neb >>CXR with bronchiectasis changes in bases   02/02/2013 Acute OV  Complains of increased SOB, tickle in back of throat, prod cough with thick light yellow mucus x10days.  refilled Levaquin 750mg  1 week ago with no relief-finished 3 days ago. Does have chronic sinus dz with previous sinus surgery.  Has sinus headache/tenderness, pressure and yellow discharge.  Leaving to go on vacation to [place] tomorrow.  No hemoptysis, wt loss, chest pain, orthopnea or edema.  No diarrhea or bloody stools.  Has coughing fits on/off.  Does not have a flutter valve.  Using budesonide and xopenex nebs .  On Zithromax 3 days a week  rec Levaquin 500mg  daily for 10 days -take with food Hold  Zithromax until done with Levaquin .  Would take probiotic daily .  Mucinex  Twice daily  As needed  Cough/congestion .  Saline nasal rinses Twice daily  As needed     06/16/2013 f/u ov/Chad Osborne re: bronchiectasis f/u  Chief Complaint  Patient presents with  . Follow-up    Pt c/o increased SOB, prod cough with thick, yellow sputum x 2 wks- he is taking his second round of levaquin and syptoms seem to be improving.   had been using levaquin 500 x 10 days which he found more effective than 750 x 5 Using bud/form neb bid with prev problems with thrush with ics/laba per hfa rec Prevnar rx  Dulera 100 Take 2 puffs first thing in am and then another 2 puffs about 12 hours later.  Only use your albuterol (proair) as rescue    06/30/2013 f/u ov/Chad Osborne re: bronchiectasis/ ? sinsusitis Chief Complaint  Patient presents with  . Follow-up    Pt states that his cough and SOB is improved since the last visit. He c/o having some sinus pressure and HA since last visit.   Much less need for saba rec  schedule sinus CT> chronic changes only rx nasal steroids   08/05/2013 f/u ov/Chad Osborne re: bronchiectasis  Chief Complaint  Patient presents with  . Follow-up    PFT done today-- [facility] working well and no complaints with breatihng  no need for rescue at all, Not limited by breathing from desired activities   Stopped zmax sev weeks prior to OV  No change symptoms rec No change in meds or medication routine (except leave off zmax)   09/21/2013 acute  ov/Chad Osborne re: acute flare of bronchiectasis Chief Complaint  Patient presents with  . Acute Visit    Pt c/o increased SOB for the past day- at onset had fever and body aches. He also c/o non prod cough. He states has had 2 rounds of levaquin since last visit.  He is using rescue inhaler approx twice per day for the past 2 wks.   w/in a week of last week used levaquin x 10 days then x 10 days ended Sep 16 2013 and flew home from [place] 09/17/13 then 09/20/13 started  new aching /tight but non prod cough,  Fever to 101   No obvious day to day or daytime variabilty or   cp or chest tightness, subjective wheeze overt sinus or hb symptoms. No unusual exp hx or h/o childhood pna/ asthma or knowledge of premature birth.  Sleeping ok without nocturnal  or early am exacerbation  of respiratory  c/o's or need for noct saba. Also denies any obvious fluctuation of symptoms with weather or environmental changes or other aggravating or alleviating factors except as outlined above   Current Medications, Allergies, Complete Past Medical History, Past Surgical History, Family History, and Social History were reviewed in [employer] record.  ROS  The following are not active complaints unless bolded sore throat, dysphagia, dental problems, itching, sneezing,  nasal congestion or excess/ purulent secretions, ear ache,   fever, chills, sweats, unintended wt loss, pleuritic or exertional cp, hemoptysis,  orthopnea pnd or leg swelling, presyncope, palpitations, heartburn, abdominal pain, anorexia, nausea, vomiting, diarrhea  or change in bowel or urinary habits, change in stools or urine, dysuria,hematuria,  rash, arthralgias, visual complaints, headache, numbness weakness or ataxia or problems with walking or coordination,  change in mood/affect or memory.                    Objective:   Physical Exam  amb wm nad  06/30/2013      166>  08/05/13  168 > 09/21/2013 159 Wt Readings from Last 3 Encounters:  06/16/13 170 lb 12.8 oz (77.474 kg)  02/02/13 168 lb 3.2 oz (76.295 kg)  01/18/13 165 lb (74.844 kg)         HEENT: nl dentition, no turbinate edema , and nl orophanx. Nl external ear canals without cough reflex,    NECK :  without JVD/Nodes/TM/ nl carotid upstrokes bilaterally   LUNGS: no acc muscle use,  Very min exp bilateral rhonchi   CV:  RRR  no s3 or murmur or increase in P2, no edema   ABD:  soft and nontender with nl excursion in  the supine position. No bruits or organomegaly, bowel sounds nl  MS:  warm without deformities, calf tenderness, cyanosis or clubbing     CXR  09/21/2013 :   Left lower lobe infiltrate.      Assessment & Plan:

## 2013-09-22 NOTE — Assessment & Plan Note (Addendum)
-   Flutter valve 02/02/13  - Prevnar given 06/16/2013  - started dulera 100 2bid 06/16/2013 > -  Alpha one AT 06/30/2013 >  MM - Sinus CT rec 06/30/2013 >  Diffuse chronic sinusitis changes  - PFT's 08/05/2013  wnl   Since just took 2 courses of levaquin and only short reprieve rec zpak/Augmentin 875 mg take one pill twice daily  X 10 days -

## 2013-09-22 NOTE — Assessment & Plan Note (Signed)
Will resume zmax 500 tiw after present rx complete

## 2013-09-23 LAB — PULMONARY FUNCTION TEST
DL/VA % PRED: 99 %
DL/VA: 4.47 ml/min/mmHg/L
DLCO UNC % PRED: 110 %
DLCO unc: 31.21 ml/min/mmHg
FEF 25-75 PRE: 2.39 L/s
FEF 25-75 Post: 2.65 L/sec
FEF2575-%CHANGE-POST: 10 %
FEF2575-%PRED-POST: 73 %
FEF2575-%PRED-PRE: 66 %
FEV1-%CHANGE-POST: 2 %
FEV1-%PRED-PRE: 83 %
FEV1-%Pred-Post: 86 %
FEV1-PRE: 3.18 L
FEV1-Post: 3.28 L
FEV1FVC-%Change-Post: 0 %
FEV1FVC-%PRED-PRE: 93 %
FEV6-%CHANGE-POST: 2 %
FEV6-%PRED-POST: 95 %
FEV6-%Pred-Pre: 92 %
FEV6-POST: 4.42 L
FEV6-Pre: 4.3 L
FEV6FVC-%CHANGE-POST: 0 %
FEV6FVC-%PRED-PRE: 103 %
FEV6FVC-%Pred-Post: 102 %
FVC-%CHANGE-POST: 3 %
FVC-%PRED-POST: 92 %
FVC-%PRED-PRE: 90 %
FVC-POST: 4.43 L
FVC-Pre: 4.3 L
POST FEV1/FVC RATIO: 74 %
Post FEV6/FVC ratio: 100 %
Pre FEV1/FVC ratio: 74 %
Pre FEV6/FVC Ratio: 100 %
RV % pred: 53 %
RV: 0.91 L
TLC % PRED: 97 %
TLC: 6.18 L

## 2014-02-17 ENCOUNTER — Ambulatory Visit (INDEPENDENT_AMBULATORY_CARE_PROVIDER_SITE_OTHER): Payer: Managed Care, Other (non HMO) | Admitting: Internal Medicine

## 2014-02-17 ENCOUNTER — Encounter (INDEPENDENT_AMBULATORY_CARE_PROVIDER_SITE_OTHER): Payer: Self-pay

## 2014-02-17 ENCOUNTER — Encounter: Payer: Self-pay | Admitting: Internal Medicine

## 2014-02-17 VITALS — BP 120/70 | HR 107 | Temp 98.4°F | Ht 67.5 in | Wt 164.6 lb

## 2014-02-17 DIAGNOSIS — J471 Bronchiectasis with (acute) exacerbation: Secondary | ICD-10-CM

## 2014-02-17 MED ORDER — PREDNISONE 10 MG PO TABS: ORAL_TABLET | ORAL | Status: DC

## 2014-02-17 MED ORDER — AMOXICILLIN-POT CLAVULANATE 875-125 MG PO TABS: 1.0000 | ORAL_TABLET | Freq: Two times a day (BID) | ORAL | Status: DC

## 2014-02-17 NOTE — Patient Instructions (Addendum)
Augmentin 875 mg take one pill twice daily  X 10 days - take at breakfast and supper with large glass of water.  It would help reduce the usual side effects (diarrhea and yeast infections) if you ate cultured yogurt at lunch.   Prednisone 10 mg take  4 each am x 2 days,   2 each am x 2 days,  1 each am x 2 days and stop   Try mucinex d as needed   Please schedule a follow up visit in 3 months but call sooner if needed with cxr on return

## 2014-02-17 NOTE — Progress Notes (Signed)
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   HPI 10/19/2012 1st pulmonary eval cc cough each am > minimal clear never bloody on zmax 500 tiw and pulmicort /xopenex bid and singulair and no limiting sob at time of initial ov. rec Continue nebulizer twice daily for now with budesonide and xopenex Stop saline for now Use proaire as needed if short of breath up to 4 hours but the less you use it the better it will work. Levaquin 750 x 5 days if needed if mucus gets nasty   01/18/2013 f/u ov/Vernecia Umble  Chief Complaint  Patient presents with  . 3 Month Follow Up    Breathing is doing well. Reports having to get Levaquin filled. Has been running and excersing more than he has in a while.   one flare of cough with discolored sputum since last ov, resolved p 5 days levaquin and no need for even mucinex at this point.  Very rare need for saba hfa only , never neb >>CXR with bronchiectasis changes in bases   02/02/2013 Acute OV  Complains of increased SOB, tickle in back of throat, prod cough with thick light yellow mucus x10days.  refilled Levaquin 750mg  1 week ago with no relief-finished 3 days ago. Does have chronic sinus dz with previous sinus surgery.  Has sinus headache/tenderness, pressure and yellow discharge.  Leaving to go on vacation to [place] tomorrow.  No hemoptysis, wt loss, chest pain, orthopnea or edema.  No diarrhea or bloody stools.  Has coughing fits on/off.  Does not have a flutter valve.  Using budesonide and xopenex nebs .  On Zithromax 3 days a week  rec Levaquin 500mg  daily for 10 days -take with food Hold  Zithromax until done with Levaquin .  Would take probiotic daily .  Mucinex  Twice daily  As needed  Cough/congestion .  Saline nasal rinses Twice daily  As needed     06/16/2013 f/u ov/Chad Osborne re: bronchiectasis f/u  Chief Complaint  Patient presents with  . Follow-up    Pt c/o increased SOB, prod cough with thick, yellow sputum x 2 wks- he is taking his second round of levaquin and syptoms seem to be improving.   had been using levaquin 500 x 10 days which he found more effective than 750 x 5 Using bud/form neb bid with prev problems with thrush with ics/laba per hfa rec Prevnar rx  Dulera 100 Take 2 puffs first thing in am and then another 2 puffs about 12 hours later.  Only use your albuterol (proair) as rescue    06/30/2013 f/u ov/[name] re: bronchiectasis/ ? sinsusitis Chief Complaint  Patient presents with  . Follow-up    Pt states that his cough and SOB is improved since the last visit. He c/o having some sinus pressure and HA since last visit.   Much less need for saba rec  schedule sinus CT> chronic changes only rx nasal steroids   08/05/2013 f/u ov/Chad Osborne re: bronchiectasis  Chief Complaint  Patient presents with  . Follow-up    PFT done today-- [facility] working well and no complaints with breatihng  no need for rescue at all, Not limited by breathing from desired activities   Stopped zmax sev weeks prior to OV  No change symptoms rec No change in meds or medication routine (except leave off zmax)   09/21/2013 acute  ov/[name] re: acute flare of bronchiectasis Chief Complaint  Patient presents with  . Acute Visit    Pt c/o increased SOB for the past day- at onset had fever and body aches. He also c/o non prod cough. He states has had 2 rounds of levaquin since last visit.  He is using rescue inhaler approx twice per day for the past 2 wks.   w/in a week of last week used levaquin x 10 days then x 10 days ended Sep 16 2013 and flew home from [place] 09/17/13 then 09/20/13 started  new aching /tight but non prod cough,  Fever to 101 rec zpak Prednisone 10 mg take  4 each am x 2 days,   2 each am x 2 days,  1 each am x 2 days and stop  Once finish zpak, assuming better then start back on zmax 500 three times a week> stopped    02/17/2014 f/u ov/Chad Osborne re: ? Bronchiectasis flare  Chief Complaint  Patient presents with  . Acute Visit    Pt c/o increased SOB, cough and aches- started last night.   already took levaquin 500  x 10 days finished 02/10/14  and felt better but then worse again 24 h prior to OV with lots of nasal congestion but no purulent secretions     No obvious day to day or daytime variabilty or   cp or chest tightness, subjective wheeze overt sinus or hb symptoms. No unusual exp hx or h/o childhood pna/ asthma or knowledge of premature birth.  Sleeping ok without nocturnal  or early am exacerbation  of respiratory  c/o's or need for noct saba. Also denies any obvious fluctuation of symptoms with weather or environmental changes or other aggravating or alleviating factors except as outlined above   Current Medications, Allergies, Complete Past Medical History, Past Surgical History, Family History, and Social History were reviewed in [employer] record.  ROS  The following are not active complaints unless bolded sore throat, dysphagia, dental problems, itching, sneezing,  nasal congestion or excess/ purulent secretions, ear ache,   fever, chills, sweats, unintended wt loss, pleuritic or exertional cp, hemoptysis,  orthopnea pnd or leg swelling, presyncope, palpitations, heartburn, abdominal pain, anorexia, nausea, vomiting, diarrhea  or change in bowel or urinary habits, change in stools or urine, dysuria,hematuria,  rash, arthralgias, visual complaints, headache, numbness weakness or ataxia or problems with walking or coordination,  change in mood/affect or memory.                    Objective:   Physical Exam  amb wm  nad  06/30/2013      166>  08/05/13  168 > 09/21/2013 159> 02/17/2014  165  Wt Readings from Last 3 Encounters:  06/16/13 170 lb 12.8 oz (77.474 kg)  02/02/13 168 lb 3.2 oz (76.295 kg)  01/18/13 165 lb (74.844 kg)         HEENT: nl dentition, no turbinate edema , and nl orophanx. Nl external ear canals without cough reflex,    NECK :  without JVD/Nodes/TM/ nl carotid upstrokes bilaterally   LUNGS: no acc muscle use,  Very min exp bilateral rhonchi   CV:  RRR  no s3 or murmur  or increase in P2, no edema   ABD:  soft and nontender with nl excursion in the supine position. No bruits or organomegaly, bowel sounds nl  MS:  warm without deformities, calf tenderness, cyanosis or clubbing     CXR  09/21/2013 :   Left lower lobe infiltrate.      Assessment & Plan:

## 2014-02-17 NOTE — Assessment & Plan Note (Signed)
-   Flutter valve 02/02/13  - Prevnar given 06/16/2013  - started dulera 100 2bid 06/16/2013 > -  Alpha one AT 06/30/2013 >  MM - Sinus CT rec 06/30/2013 >  Diffuse chronic sinusitis changes  - PFT's 08/05/2013  wnl  - resumed tiw zmax 500 09/29/13 > d/c ? When by pt   DDX of  difficult airways management all start with A and  include Adherence, Ace Inhibitors, Acid Reflux, Active Sinus Disease, Alpha 1 Antitripsin deficiency, Anxiety masquerading as Airways dz,  ABPA,  allergy(esp in young), Aspiration (esp in elderly), Adverse effects of DPI,  Active smokers, plus two Bs  = Bronchiectasis and Beta blocker use..and one C= CHF  Adherence is always the initial "prime suspect" and is a multilayered concern that requires a "trust but verify" approach in every patient - starting with knowing how to use medications, especially inhalers, correctly, keeping up with refills and understanding the fundamental difference between maintenance and prns vs those medications only taken for a very short course and then stopped and not refilled.   ? Active sinus dz > Augmentin 875 mg take one pill twice daily  X 10 days - take at breakfast and supper with large glass of water.  It would help reduce the usual side effects (diarrhea and yeast infections) if you ate cultured yogurt at lunch.  ? Allergic/ asthmatic component > Prednisone 10 mg take  4 each am x 2 days,   2 each am x 2 days,  1 each am x 2 days and stop   Bronchiectasis actually appears well controlled on present rx >   Each maintenance medication was reviewed in detail including most importantly the difference between maintenance and as needed and under what circumstances the prns are to be used.  Please see instructions for details which were reviewed in writing and the patient given a copy.

## 2014-02-25 ENCOUNTER — Other Ambulatory Visit: Payer: Self-pay | Admitting: Internal Medicine

## 2014-03-03 ENCOUNTER — Telehealth: Payer: Self-pay | Admitting: Internal Medicine

## 2014-03-03 MED ORDER — AMOXICILLIN-POT CLAVULANATE 875-125 MG PO TABS: 1.0000 | ORAL_TABLET | Freq: Two times a day (BID) | ORAL | Status: DC

## 2014-03-03 NOTE — Telephone Encounter (Signed)
Spoke with the pt  He states that he had improved after last visit 02/17/14- had taken augmentin 875 x 10 days  He states last night started running low grade temp and having prod cough with brown/yellow sputum  He also states that his chest feels tight  "everything is just like the last 2 times"  I advised needs ov and he declined, stating he is currently on his way to [place]  MW, please advise, thanks!

## 2014-03-03 NOTE — Telephone Encounter (Signed)
Spoke with the pt and notified of recs per MW  He verbalized understanding  Rx was sent to pharm in [place]  He will call for ov when he returns

## 2014-03-03 NOTE — Telephone Encounter (Signed)
Ok  To do the augmentin x 10day then ov bfore any more phone medicine

## 2014-03-17 ENCOUNTER — Telehealth: Payer: Self-pay | Admitting: Internal Medicine

## 2014-03-17 NOTE — Telephone Encounter (Signed)
Pt aware of recs. He is aware to seek emergency care if worsens over the weekend. Nothing further needed

## 2014-03-17 NOTE — Telephone Encounter (Signed)
Spoke with the pt  He states throat started feeling scratchy last night and then started to cough  He states also having cough- prod with minimal sputum- ? Color He states not SOB "but feels pressure around lungs" Had low grade temp last night but none today  We had called in 10 day course of augmentin for him on 03/03/14  I have scheduled him for appt with MW on 03/20/14  Will forward to TP to provide recs in the meantime Please advise thanks!

## 2014-03-17 NOTE — Telephone Encounter (Signed)
Use mucinex , fluids and tylenol  If sick again will need to be seen before any more abx given as 2 courses in last 3 weeks  Will need to be seen  If worse sooner ov , urgent care or ER   Please contact office for sooner follow up if symptoms do not improve or worsen or seek emergency care

## 2014-03-20 ENCOUNTER — Ambulatory Visit: Payer: Managed Care, Other (non HMO) | Admitting: Internal Medicine

## 2014-03-20 ENCOUNTER — Ambulatory Visit (INDEPENDENT_AMBULATORY_CARE_PROVIDER_SITE_OTHER): Payer: Managed Care, Other (non HMO) | Admitting: Internal Medicine

## 2014-03-20 ENCOUNTER — Encounter: Payer: Self-pay | Admitting: Internal Medicine

## 2014-03-20 ENCOUNTER — Ambulatory Visit (INDEPENDENT_AMBULATORY_CARE_PROVIDER_SITE_OTHER)
Admission: RE | Admit: 2014-03-20 | Discharge: 2014-03-20 | Disposition: A | Discharge status: Home / Self Care | Payer: Managed Care, Other (non HMO) | Source: Ambulatory Visit | Attending: Internal Medicine | Admitting: Internal Medicine

## 2014-03-20 DIAGNOSIS — J471 Bronchiectasis with (acute) exacerbation: Secondary | ICD-10-CM

## 2014-03-20 MED ORDER — AZITHROMYCIN 500 MG PO TABS: ORAL_TABLET | ORAL | Status: DC

## 2014-03-20 MED ORDER — AMOXICILLIN-POT CLAVULANATE 875-125 MG PO TABS: 1.0000 | ORAL_TABLET | Freq: Two times a day (BID) | ORAL | Status: DC

## 2014-03-20 NOTE — Progress Notes (Signed)
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   HPI 10/19/2012 1st pulmonary eval cc cough each am > minimal clear never bloody on zmax 500 tiw and pulmicort /xopenex bid and singulair and no limiting sob at time of initial ov. rec Continue nebulizer twice daily for now with budesonide and xopenex Stop saline for now Use proaire as needed if short of breath up to 4 hours but the less you use it the better it will work. Levaquin 750 x 5 days if needed if mucus gets nasty   01/18/2013 f/u ov/Chad Osborne  Chief Complaint  Patient presents with  . 3 Month Follow Up    Breathing is doing well. Reports having to get Levaquin filled. Has been running and excersing more than he has in a while.   one flare of cough with discolored sputum since last ov, resolved p 5 days levaquin and no need for even mucinex at this point.  Very rare need for saba hfa only , never neb >>CXR with bronchiectasis changes in bases   02/02/2013 Acute OV  Complains of increased SOB, tickle in back of throat, prod cough with thick light yellow mucus x10days.  refilled Levaquin 750mg  1 week ago with no relief-finished 3 days ago. Does have chronic sinus dz with previous sinus surgery.  Has sinus headache/tenderness, pressure and yellow discharge.  Leaving to go on vacation to [place] tomorrow.  No hemoptysis, wt loss, chest pain, orthopnea or edema.  No diarrhea or bloody stools.  Has coughing fits on/off.  Does not have a flutter valve.  Using budesonide and xopenex nebs .  On Zithromax 3 days a week  rec Levaquin 500mg  daily for 10 days -take with food Hold  Zithromax until done with Levaquin .  Would take probiotic daily .  Mucinex  Twice daily  As needed  Cough/congestion .  Saline nasal rinses Twice daily  As needed     06/16/2013 f/u ov/Chad Osborne re: bronchiectasis f/u  Chief Complaint  Patient presents with  . Follow-up    Pt c/o increased SOB, prod cough with thick, yellow sputum x 2 wks- he is taking his second round of levaquin and syptoms seem to be improving.   had been using levaquin 500 x 10 days which he found more effective than 750 x 5 Using bud/form neb bid with prev problems with thrush with ics/laba per hfa rec Prevnar rx  Dulera 100 Take 2 puffs first thing in am and then another 2 puffs about 12 hours later.  Only use your albuterol (proair) as rescue    06/30/2013 f/u ov/Chad Osborne re: bronchiectasis/ ? sinsusitis Chief Complaint  Patient presents with  . Follow-up    Pt states that his cough and SOB is improved since the last visit. He c/o having some sinus pressure and HA since last visit.   Much less need for saba rec  schedule sinus CT> chronic changes only rx nasal steroids   08/05/2013 f/u ov/Chad Osborne re: bronchiectasis  Chief Complaint  Patient presents with  . Follow-up    PFT done today-- [facility] working well and no complaints with breatihng  no need for rescue at all, Not limited by breathing from desired activities   Stopped zmax sev weeks prior to OV  No change symptoms rec No change in meds or medication routine (except leave off zmax)   09/21/2013 acute  ov/Chad Osborne re: acute flare of bronchiectasis Chief Complaint  Patient presents with  . Acute Visit    Pt c/o increased SOB for the past day- at onset had fever and body aches. He also c/o non prod cough. He states has had 2 rounds of levaquin since last visit.  He is using rescue inhaler approx twice per day for the past 2 wks.   w/in a week of last week used levaquin x 10 days then x 10 days ended Sep 16 2013 and flew home from [place] 09/17/13 then 09/20/13 started  new aching /tight but non prod cough,  Fever to 101 rec zpak Prednisone 10 mg take  4 each am x 2 days,   2 each am x 2 days,  1 each am x 2 days and stop  Once finish zpak, assuming better then start back on zmax 500 three times a week> stopped    02/17/2014 f/u ov/Chad Osborne re: ? Bronchiectasis flare  Chief Complaint  Patient presents with  . Acute Visit    Pt c/o increased SOB, cough and aches- started last night.   already took levaquin 500  x 10 days finished 02/10/14  and felt better but then worse again 24 h prior to OV with lots of nasal congestion but no purulent secretions  rec Augmentin 875 mg take one pill twice daily  X 10 days - take at breakfast and supper with large glass of water.  It would help reduce the usual side effects (diarrhea and yeast infections) if you ate cultured yogurt at lunch.  Prednisone 10 mg take  4 each am x 2 days,   2 each am x 2 days,  1 each am x 2 days and stop  Try mucinex d as needed   03/03/14 augmentin x 10 days     03/20/2014 f/u ov/Chad Osborne re: persistent cough/ bronchiectasis Chief Complaint  Patient presents with  . Acute Visit    Pt c/o increased cough and chest pressure x 5 days. He c/o "horrible sinus headache"- since this am. He states has noticed thick, brown nasal discharge.    he had responded well to augmentin x 2 cycles then abruptly ill with onset of sinus congestion first, not following up with ent or using nasal hygiene (netipot) chronically or with excac.  No response to mucinex d    No obvious day to day or daytime variabilty or sob  cp or chest tightness, subjective wheeze overt sinus or hb symptoms. No unusual exp hx or h/o childhood pna/ asthma or knowledge of premature birth.  Sleeping ok without nocturnal  or early am exacerbation  of respiratory  c/o's or need for noct saba. Also denies any obvious fluctuation of symptoms with weather or environmental changes or other aggravating or alleviating factors except as outlined above    Current Medications, Allergies, Complete Past Medical History, Past Surgical History, Family History, and Social History were reviewed in [employer] record.  ROS  The following are not active complaints unless bolded sore throat, dysphagia, dental problems, itching, sneezing,  nasal congestion or excess/ purulent secretions, ear ache,   fever, chills, sweats, unintended wt loss, pleuritic or exertional cp, hemoptysis,  orthopnea pnd or leg swelling, presyncope, palpitations, heartburn,  abdominal pain, anorexia, nausea, vomiting, diarrhea  or change in bowel or urinary habits, change in stools or urine, dysuria,hematuria,  rash, arthralgias, visual complaints, headache, numbness weakness or ataxia or problems with walking or coordination,  change in mood/affect or memory.                    Objective:   Physical Exam  amb wm nad  06/30/2013      166>  08/05/13  168 > 09/21/2013 159> 02/17/2014  165 > 03/20/2014 160  Wt Readings from Last 3 Encounters:  06/16/13 170 lb 12.8 oz (77.474 kg)  02/02/13 168 lb 3.2 oz (76.295 kg)  01/18/13 165 lb (74.844 kg)         HEENT: nl dentition,  and nl orophanx. Nl external ear canals without cough reflex, Mucopurulent secretions on r with turbinate swelling bilaterally     NECK :  without JVD/Nodes/TM/ nl carotid upstrokes bilaterally   LUNGS: no acc muscle use,    bilateral mid exp rhonchi   CV:  RRR  no s3 or murmur or increase in P2, no edema   ABD:  soft and nontender with nl excursion in the supine position. No bruits or organomegaly, bowel sounds nl  MS:  warm without deformities, calf tenderness, cyanosis or clubbing      CXR  03/20/2014 : Bibasilar atelectasis and/or scarring with chronic left lower lobe infiltrate and probable bronchiectasis . No acute abnormality .     Assessment & Plan:

## 2014-03-20 NOTE — Patient Instructions (Addendum)
zithromax 500 three times a week indefinitely  augmentin x 21 days See Dr Redmond Baseman before you finish the Augmentin  Please remember to go to the  x-ray department downstairs for your tests - we will call you with the results when they are available.  Please schedule a follow up visit in 3 months but call sooner if needed

## 2014-03-21 NOTE — Progress Notes (Signed)
Quick Note:  Spoke with pt and notified of results per Dr. Wert. Pt verbalized understanding and denied any questions.  ______ 

## 2014-03-21 NOTE — Assessment & Plan Note (Addendum)
DDX of  difficult airways management all start with A and  include Adherence, Ace Inhibitors, Acid Reflux, Active Sinus Disease, Alpha 1 Antitripsin deficiency, Anxiety masquerading as Airways dz,  ABPA,  allergy(esp in young), Aspiration (esp in elderly), Adverse effects of DPI,  Active smokers, plus two Bs  = Bronchiectasis (which he has) and Beta blocker use..and one C= CHF   Adherence is always the initial "prime suspect" and is a multilayered concern that requires a "trust but verify" approach in every patient - starting with knowing how to use medications, especially inhalers, correctly, keeping up with refills and understanding the fundamental difference between maintenance and prns vs those medications only taken for a very short course and then stopped and not refilled.   ? Acute / chronic sinusitis may be a factor here > rx x augmentin x 21 day and ent/ resume nettipot  ? Allergic component > doubt it, hold further steroids and continue singulair and dulera 100 2bid   Bronchiectasis > resume zmax 500 tiw at this point as did better historically on it    Each maintenance medication was reviewed in detail including most importantly the difference between maintenance and as needed and under what circumstances the prns are to be used.  Please see instructions for details which were reviewed in writing and the patient given a copy.

## 2014-05-26 ENCOUNTER — Other Ambulatory Visit: Payer: Self-pay | Admitting: Internal Medicine

## 2014-06-20 ENCOUNTER — Ambulatory Visit: Payer: Managed Care, Other (non HMO) | Admitting: Internal Medicine

## 2014-06-27 ENCOUNTER — Ambulatory Visit (INDEPENDENT_AMBULATORY_CARE_PROVIDER_SITE_OTHER): Payer: Managed Care, Other (non HMO) | Admitting: Internal Medicine

## 2014-06-27 ENCOUNTER — Encounter: Payer: Self-pay | Admitting: Internal Medicine

## 2014-06-27 VITALS — BP 112/70 | HR 66 | Ht 67.5 in | Wt 152.0 lb

## 2014-06-27 DIAGNOSIS — J479 Bronchiectasis, uncomplicated: Secondary | ICD-10-CM

## 2014-06-27 DIAGNOSIS — Z23 Encounter for immunization: Secondary | ICD-10-CM

## 2014-06-27 DIAGNOSIS — J471 Bronchiectasis with (acute) exacerbation: Secondary | ICD-10-CM

## 2014-06-27 MED ORDER — AZITHROMYCIN 500 MG PO TABS: ORAL_TABLET | ORAL | Status: DC

## 2014-06-27 MED ORDER — MONTELUKAST SODIUM 10 MG PO TABS: 10.0000 mg | ORAL_TABLET | Freq: Every day | ORAL | Status: DC

## 2014-06-27 MED ORDER — MOMETASONE FURO-FORMOTEROL FUM 100-5 MCG/ACT IN AERO: INHALATION_SPRAY | RESPIRATORY_TRACT | Status: DC

## 2014-06-27 NOTE — Patient Instructions (Signed)
No change in medications  If your insurance requires you to use [facility] I strongly recommend the two from [place]  - otherwise we'll see you back here every 6 months

## 2014-06-27 NOTE — Progress Notes (Signed)
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   HPI 10/19/2012 1st pulmonary eval cc cough each am > minimal clear never bloody on zmax 500 tiw and pulmicort /xopenex bid and singulair and no limiting sob at time of initial ov. rec Continue nebulizer twice daily for now with budesonide and xopenex Stop saline for now Use proaire as needed if short of breath up to 4 hours but the less you use it the better it will work. Levaquin 750 x 5 days if needed if mucus gets nasty   06/30/2013 f/u ov/Ronzell Laban re: bronchiectasis/ ? sinsusitis Chief Complaint  Patient presents with  . Follow-up    Pt states that his cough and SOB is improved since the last visit. He c/o having some sinus pressure and HA since last visit.   Much less need for saba rec  schedule sinus CT> chronic changes only rx nasal steroids     03/03/14 augmentin x 10 days    03/20/2014 f/u ov/Nanami Whitelaw re: persistent cough/ bronchiectasis Chief Complaint  Patient presents with  . Acute Visit    Pt c/o increased cough and chest pressure x 5 days. He c/o "horrible sinus headache"- since this am. He states has noticed thick, brown nasal discharge.    he had responded well to augmentin x 2 cycles then abruptly ill with onset of sinus congestion first, not following up with ent or using nasal hygiene (netipot) chronically or with excac.  No response to mucinex d   rec zithromax 500 three times a week indefinitely  augmentin x 21 days See Dr Redmond Baseman before you finish the Augmentin  Please remember to go to the  x-ray department     06/27/2014 f/u ov/Virgina Deakins re: bronchiectasis  improved symptoms on zmax 500 tiw /dulera 100 2bid  Chief Complaint  Patient presents with  . Follow-up    Pt states doing well today and "no more cough than the usual"  no new co's today.   rare need for saba  Never f/u with Redmond Baseman and some persistent nasal congestion    Not limited by breathing from desired activities    No obvious day to day or daytime variabilty or sob  cp or chest tightness, subjective wheeze overt sinus or hb symptoms. No unusual exp hx or h/o childhood pna/ asthma or knowledge of premature birth.  Sleeping ok without nocturnal  or early am exacerbation  of respiratory  c/o's or need for noct saba. Also denies any obvious fluctuation of symptoms with weather or environmental changes or other aggravating or alleviating factors except as outlined above   Current Medications, Allergies, Complete Past Medical History, Past Surgical History, Family History, and Social History were reviewed in [employer] record.  ROS  The following are not active complaints unless bolded sore throat, dysphagia, dental problems, itching, sneezing,  nasal congestion or excess/ purulent secretions, ear ache,   fever, chills, sweats, unintended wt loss, pleuritic or exertional cp, hemoptysis,  orthopnea pnd or leg swelling, presyncope, palpitations, heartburn, abdominal pain, anorexia, nausea, vomiting, diarrhea  or change in bowel or urinary habits, change in stools  or urine, dysuria,hematuria,  rash, arthralgias, visual complaints, headache, numbness weakness or ataxia or problems with walking or coordination,  change in mood/affect or memory.                    Objective:   Physical Exam  amb wm nad  06/30/2013      166>  08/05/13  168 > 09/21/2013 159> 02/17/2014  165 > 03/20/2014 160> 06/27/2014 152   Wt Readings from Last 3 Encounters:  06/16/13 170 lb 12.8 oz (77.474 kg)  02/02/13 168 lb 3.2 oz (76.295 kg)  01/18/13 165 lb (74.844 kg)         HEENT: nl  dentition,  and nl orophanx. Nl external ear canals without cough reflex, Mucopurulent secretions on r with turbinate swelling bilaterally     NECK :  without JVD/Nodes/TM/ nl carotid upstrokes bilaterally   LUNGS: no acc muscle use,    bilateral mid to late exp rhonchi   CV:  RRR  no s3 or murmur or increase in P2, no edema   ABD:  soft and nontender with nl excursion in the supine position. No bruits or organomegaly, bowel sounds nl  MS:  warm without deformities, calf tenderness, cyanosis or clubbing      CXR  03/20/2014 : Bibasilar atelectasis and/or scarring with chronic left lower lobe infiltrate and probable bronchiectasis . No acute abnormality .     Assessment & Plan:

## 2014-06-28 ENCOUNTER — Encounter: Payer: Self-pay | Admitting: Internal Medicine

## 2014-06-28 NOTE — Assessment & Plan Note (Signed)
-   Flutter valve 02/02/13  - Prevnar given 06/16/2013  - started dulera 100 2bid 06/16/2013 > -  Alpha one AT 06/30/2013 >  MM - Sinus CT rec 06/30/2013 >  Diffuse chronic sinusitis changes  - PFT's 08/05/2013  wnl  - resumed tiw zmax 500 09/29/13 > d/c ? When by pt > resumed 03/20/14  Doing much better back on the zmax 500 tid> no change rx needed / f/u can be q 6 m    Each maintenance medication was reviewed in detail including most importantly the difference between maintenance and as needed and under what circumstances the prns are to be used.  Please see instructions for details which were reviewed in writing and the patient given a copy.    Marland Kitchen

## 2014-07-12 ENCOUNTER — Telehealth: Payer: Self-pay | Admitting: Internal Medicine

## 2014-07-12 MED ORDER — MOMETASONE FURO-FORMOTEROL FUM 100-5 MCG/ACT IN AERO: INHALATION_SPRAY | RESPIRATORY_TRACT | Status: DC

## 2014-07-12 NOTE — Telephone Encounter (Signed)
The medication was dulera, sorry.Hillery Hunter

## 2014-07-12 NOTE — Telephone Encounter (Signed)
Pt needs 90 day supply of Dulera sent to his pharmacy. This has been taken care of. Nothing further was needed.

## 2014-08-16 ENCOUNTER — Telehealth: Payer: Self-pay | Admitting: Internal Medicine

## 2014-08-16 MED ORDER — LEVOFLOXACIN 750 MG PO TABS: 750.0000 mg | ORAL_TABLET | Freq: Every day | ORAL | Status: DC

## 2014-08-16 NOTE — Telephone Encounter (Signed)
Spoke with pt and advised of Dr Janifer Adie recommendations.  Pt verbalized understanding and will call us if symptoms do not improve.

## 2014-08-16 NOTE — Telephone Encounter (Signed)
Pt c/o increased allergies, sinus drainage (yellow), runny nose, low grade fever, facial pain, prod cough (yellow), and increased sob for past several days.  Still on Azithromycin three times a week maintenance. And also taking Zyrtec, flonase and Mucinex D.  Pt is concerned that this is progressing.  Pt states that Dr Melvyn Novas has given Levaquin and sometimes a pred taper in the past that works well.  Please advise.

## 2014-08-16 NOTE — Telephone Encounter (Signed)
Ok to call in levaquin 750mg  one a day for 7 days for sinusitis. Would like to hold off on prednisone, but have him call us if he is not making good progress.

## 2014-08-30 ENCOUNTER — Other Ambulatory Visit: Payer: Self-pay | Admitting: Internal Medicine

## 2014-09-20 ENCOUNTER — Telehealth: Payer: Self-pay | Admitting: Internal Medicine

## 2014-09-20 MED ORDER — AMOXICILLIN-POT CLAVULANATE 875-125 MG PO TABS: 1.0000 | ORAL_TABLET | Freq: Two times a day (BID) | ORAL | Status: DC

## 2014-09-20 NOTE — Telephone Encounter (Signed)
Augmentin 875 mg take one pill twice daily  X 10 days - take at breakfast and supper with large glass of water.  It would help reduce the usual side effects (diarrhea and yeast infections) if you ate cultured yogurt at lunch.  

## 2014-09-20 NOTE — Telephone Encounter (Signed)
Pt aware of recs.  Abx sent in.  Nothing further needed.

## 2014-09-20 NOTE — Telephone Encounter (Signed)
Spoke with pt, states he is starting to have a bronchiectasis exacerbation: c/o prod cough with foul tasting yellow mucus X3 days, chest tightness, sinus congestion.  Denies fever.  Pt has not taken anything aside from maintenance meds to alleviate symptoms.   [facility] [address] in [place].    MW please advise on recs.  Thanks!

## 2014-11-09 ENCOUNTER — Telehealth: Payer: Self-pay | Admitting: Internal Medicine

## 2014-11-09 NOTE — Telephone Encounter (Signed)
Called pt and appt scheduled to see MW tomorrow at 3:45. Nothing further needed

## 2014-11-10 ENCOUNTER — Encounter: Payer: Self-pay | Admitting: Internal Medicine

## 2014-11-10 ENCOUNTER — Ambulatory Visit (INDEPENDENT_AMBULATORY_CARE_PROVIDER_SITE_OTHER): Payer: Managed Care, Other (non HMO) | Admitting: Internal Medicine

## 2014-11-10 VITALS — BP 110/70 | HR 84 | Ht 67.5 in | Wt 152.4 lb

## 2014-11-10 DIAGNOSIS — J471 Bronchiectasis with (acute) exacerbation: Secondary | ICD-10-CM | POA: Diagnosis not present

## 2014-11-10 MED ORDER — ALBUTEROL SULFATE HFA 108 (90 BASE) MCG/ACT IN AERS: 2.0000 | INHALATION_SPRAY | Freq: Four times a day (QID) | RESPIRATORY_TRACT | Status: DC | PRN

## 2014-11-10 MED ORDER — AMOXICILLIN-POT CLAVULANATE 875-125 MG PO TABS: 1.0000 | ORAL_TABLET | Freq: Two times a day (BID) | ORAL | Status: DC

## 2014-11-10 NOTE — Progress Notes (Signed)
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   HPI 10/19/2012 1st pulmonary eval cc cough each am > minimal clear never bloody on zmax 500 tiw and pulmicort /xopenex bid and singulair and no limiting sob at time of initial ov. rec Continue nebulizer twice daily for now with budesonide and xopenex Stop saline for now Use proaire as needed if short of breath up to 4 hours but the less you use it the better it will work. Levaquin 750 x 5 days if needed if mucus gets nasty  03/20/2014 f/u ov/Chad Osborne re: persistent cough/ bronchiectasis Chief Complaint  Patient presents with  . Acute Visit    Pt c/o increased cough and chest pressure x 5 days. He c/o "horrible sinus headache"- since this am. He states has noticed thick, brown nasal discharge.    he had responded well to augmentin x 2 cycles then abruptly ill with onset of sinus congestion first, not following up with ent or using nasal hygiene (netipot) chronically or with excac.  No response to mucinex d   rec zithromax 500 three times a week indefinitely  augmentin x 21 days See Dr Redmond Baseman before you finish the Augmentin  Please remember to go to the  x-ray department     06/27/2014 f/u ov/Chad Osborne re: bronchiectasis improved symptoms on zmax 500 tiw /dulera 100 2bid  Chief Complaint  Patient presents with  . Follow-up    Pt states doing well today and "no more cough than the usual"  no new co's today.   rare need for saba  Never f/u with Redmond Baseman and some persistent nasal congestion    Not limited by breathing from desired activities   rec No change rx    11/10/2014  Acute  ov/Chad Osborne re: bronchiectasis flare maint on zmax 500 tiw and dulera 100 2bid/ singulair  Chief Complaint  Patient presents with  . Acute Visit    C/o prod cough (thick yellow phlem), chest tx, increase SOB   acutely worse x one week assoc with nasal congesiton/ ear pan on L / doe bu nto at rest/ only using saba rarely despite flare/ comfortable at rest    No obvious day to day or daytime variabilty or  cp or chest tightness, subjective wheeze overt sinus or hb symptoms. No unusual exp hx or h/o childhood pna/ asthma or knowledge of premature birth.  Sleeping ok without nocturnal  or early am exacerbation  of respiratory  c/o's or need for noct saba. Also denies any obvious fluctuation of symptoms with weather or environmental changes or other aggravating or alleviating factors except as outlined above   Current Medications, Allergies, Complete Past Medical History, Past Surgical History, Family History, and Social History were reviewed in [employer] record.  ROS  The following are not active complaints unless bolded sore throat, dysphagia, dental problems, itching, sneezing,  nasal congestion or excess/ purulent secretions, L ear ache,   fever, chills, sweats, unintended wt loss, pleuritic or exertional cp, hemoptysis,  orthopnea pnd or leg swelling, presyncope, palpitations, heartburn, abdominal pain, anorexia, nausea, vomiting, diarrhea  or change in  bowel or urinary habits, change in stools or urine, dysuria,hematuria,  rash, arthralgias, visual complaints, headache, numbness weakness or ataxia or problems with walking or coordination,  change in mood/affect or memory.              Objective:   Physical Exam  amb wm nad  06/30/2013      166>  08/05/13  168 > 09/21/2013 159> 02/17/2014  165 > 03/20/2014 160> 06/27/2014 152  > 11/10/2014  152  Wt Readings from Last 3 Encounters:  06/16/13 170 lb 12.8 oz (77.474 kg)  02/02/13 168 lb 3.2 oz (76.295 kg)  01/18/13 165 lb  (74.844 kg)         HEENT: nl dentition,  and nl orophanx. Nl external ear canals without cough reflex,  Mucopurulent secretions on r with turbinate swelling bilaterally  / L ear erythematous / R ear with Tube    NECK :  without JVD/Nodes/TM/ nl carotid upstrokes bilaterally   LUNGS: no acc muscle use,  bilateral mid  exp rhonchi/ exp cough  No true wheeze    CV:  RRR  no s3 or murmur or increase in P2, no edema   ABD:  soft and nontender with nl excursion in the supine position. No bruits or organomegaly, bowel sounds nl  MS:  warm without deformities, calf tenderness, cyanosis or clubbing      CXR  03/20/2014 : Bibasilar atelectasis and/or scarring with chronic left lower lobe infiltrate and probable bronchiectasis . No acute abnormality .     Assessment & Plan:

## 2014-11-10 NOTE — Patient Instructions (Signed)
Augmentin 875 mg take one pill twice daily  X 10 days - take at breakfast and supper with large glass of water.  It would help reduce the usual side effects (diarrhea and yeast infections) if you ate cultured yogurt at lunch.   Set up follow with ent in next 2-3 weeks   [facility] would be a very good option

## 2014-11-11 ENCOUNTER — Encounter: Payer: Self-pay | Admitting: Internal Medicine

## 2014-11-11 NOTE — Assessment & Plan Note (Signed)
-   Flutter valve 02/02/13  - Prevnar given 06/16/2013  - started dulera 100 2bid 06/16/2013 > -  Alpha one AT 06/30/2013 >  MM - Sinus CT rec 06/30/2013 >  Diffuse chronic sinusitis changes  - PFT's 08/05/2013  wnl  - resumed tiw zmax 500 09/29/13 > d/c ? When by pt > resumed 03/20/14   Acute flare in setting of rhinitis/ otitis/ ? Sinusitis > rx Augmentin 875 mg take one pill twice daily  X 10 days -   And f/u with ent planned   I had an extended discussion with the patient reviewing all relevant studies completed to date and  lasting 15 to 20 minutes of a 25 minute visit on the following ongoing concerns:  Each maintenance medication was reviewed in detail including most importantly the difference between maintenance and as needed and under what circumstances the prns are to be used.  Please see instructions for details which were reviewed in writing and the patient given a copy.   F/u can be in [place] if desired as lives there and ent is there.

## 2014-11-22 ENCOUNTER — Telehealth: Payer: Self-pay | Admitting: *Deleted

## 2014-11-22 MED ORDER — LEVOFLOXACIN 500 MG PO TABS: 500.0000 mg | ORAL_TABLET | Freq: Every day | ORAL | Status: DC

## 2014-11-22 NOTE — Telephone Encounter (Signed)
Patient was here a couple weeks ago and was given amoxicillin.  Dr. Melvyn Novas advised him that if Amoxicillin doesn't work, to call him.  Finished Amoxicillin yesterday and has already noticed the thick mucus, sob, foul taste in his mouth, cough, scratchy throat.  Patient is going out of town tomorrow for vacation.    Patient would like to know if Dr. Melvyn Novas could call him in some medication to take, he doesn't want to get worse during his vacation.  No Known Allergies

## 2014-11-22 NOTE — Telephone Encounter (Signed)
levaquin 500 mg daily x 10 days prn

## 2014-11-22 NOTE — Telephone Encounter (Signed)
Rx sent to pharmacy. Patient notified. Nothing further needed.  

## 2015-01-16 ENCOUNTER — Telehealth: Payer: Self-pay | Admitting: Internal Medicine

## 2015-01-16 MED ORDER — CEFDINIR 300 MG PO CAPS: 300.0000 mg | ORAL_CAPSULE | Freq: Two times a day (BID) | ORAL | Status: DC

## 2015-01-16 NOTE — Telephone Encounter (Signed)
Called and spoke to pt. Informed him of the recs per MW. Rx sent to preferred pharmacy. Pt verbalized understanding and denied any further questions or concerns at this time.   

## 2015-01-16 NOTE — Telephone Encounter (Signed)
Patient returned call, states he has no more meetings today so he should be available to answer the call next time, [phone].

## 2015-01-16 NOTE — Telephone Encounter (Signed)
lmtcb

## 2015-01-16 NOTE — Telephone Encounter (Signed)
Patient says that he has a lot of chest congestion, yellow, foul tasting mucus, tightness in chest, SOB.  Thinks it may be coming from sinuses because he has drainage down his throat and his throat is very sore.  Coughing a lot.    No Known Allergies  [facility] [address] [place]

## 2015-01-16 NOTE — Telephone Encounter (Signed)
omnicef 300 mg bid x 10 days then ov if not better 

## 2015-02-20 ENCOUNTER — Other Ambulatory Visit: Payer: Self-pay | Admitting: Internal Medicine

## 2015-05-28 ENCOUNTER — Other Ambulatory Visit: Payer: Self-pay | Admitting: Internal Medicine

## 2015-07-16 ENCOUNTER — Other Ambulatory Visit: Payer: Self-pay | Admitting: Internal Medicine

## 2015-08-23 ENCOUNTER — Other Ambulatory Visit: Payer: Self-pay | Admitting: Internal Medicine

## 2015-09-03 ENCOUNTER — Other Ambulatory Visit: Payer: Self-pay | Admitting: Internal Medicine

## 2015-11-21 ENCOUNTER — Other Ambulatory Visit: Payer: Self-pay | Admitting: Internal Medicine

## 2016-01-14 ENCOUNTER — Ambulatory Visit (INDEPENDENT_AMBULATORY_CARE_PROVIDER_SITE_OTHER)
Admission: RE | Admit: 2016-01-14 | Discharge: 2016-01-14 | Disposition: A | Discharge status: Home / Self Care | Payer: Managed Care, Other (non HMO) | Source: Ambulatory Visit | Attending: Internal Medicine | Admitting: Internal Medicine

## 2016-01-14 ENCOUNTER — Encounter: Payer: Self-pay | Admitting: Internal Medicine

## 2016-01-14 ENCOUNTER — Ambulatory Visit (INDEPENDENT_AMBULATORY_CARE_PROVIDER_SITE_OTHER): Payer: Managed Care, Other (non HMO) | Admitting: Internal Medicine

## 2016-01-14 VITALS — BP 120/74 | HR 68 | Wt 166.0 lb

## 2016-01-14 DIAGNOSIS — J471 Bronchiectasis with (acute) exacerbation: Secondary | ICD-10-CM

## 2016-01-14 DIAGNOSIS — R05 Cough: Secondary | ICD-10-CM

## 2016-01-14 DIAGNOSIS — R059 Cough, unspecified: Secondary | ICD-10-CM

## 2016-01-14 LAB — NITRIC OXIDE: Nitric Oxide: 5

## 2016-01-14 MED ORDER — ALBUTEROL SULFATE HFA 108 (90 BASE) MCG/ACT IN AERS
2.0000 | INHALATION_SPRAY | Freq: Four times a day (QID) | RESPIRATORY_TRACT | 3 refills | Status: DC | PRN
Start: 2016-01-14 — End: 2017-06-08

## 2016-01-14 MED ORDER — FAMOTIDINE 20 MG PO TABS: ORAL_TABLET | ORAL | 2 refills | Status: DC

## 2016-01-14 MED ORDER — MONTELUKAST SODIUM 10 MG PO TABS: 10.0000 mg | ORAL_TABLET | Freq: Every day | ORAL | 0 refills | Status: DC

## 2016-01-14 MED ORDER — PREDNISONE 10 MG PO TABS: ORAL_TABLET | ORAL | 0 refills | Status: DC

## 2016-01-14 NOTE — Patient Instructions (Addendum)
Pepcid 20 mg ac at bedtime x 6 weeks   GERD (REFLUX)  is an extremely common cause of respiratory symptoms just like yours , many times with no obvious heartburn at all.    It can be treated with medication, but also with lifestyle changes including elevation of the head of your bed (ideally with 6 inch  bed blocks),  Smoking cessation, avoidance of late meals, excessive alcohol, and avoid fatty foods, chocolate, peppermint, colas, red wine, and acidic juices such as orange juice.  NO MINT OR MENTHOL PRODUCTS SO NO COUGH DROPS   USE SUGARLESS CANDY INSTEAD (Jolley ranchers or Stover's or Life Savers) or even ice chips will also do - the key is to swallow to prevent all throat clearing. NO OIL BASED VITAMINS - use powdered substitutes.  Prednisone 10 mg take  4 each am x 2 days,   2 each am x 2 days,  1 each am x 2 days and stop   Use the flutter valve as much as you can   Plan A = Automatic =  Dulera 100 Take 2 puffs first thing in am and then another 2 puffs about 12 hours later.    Plan B = Backup Only use your albuterol as a rescue medication to be used if you can't catch your breath by resting or doing a relaxed purse lip breathing pattern.  - The less you use it, the better it will work when you need it. - Ok to use the inhaler up to 2 puffs  every 4 hours if you must but call for appointment if use goes up over your usual need - Don't leave home without it !!  (think of it like the spare tire for your car)   Please remember to go to the x-ray department downstairs for your tests - we will call you with the results when they are available.     Please schedule a follow up office visit in 6 weeks, call sooner if needed  - bring inhalers with you  Late add:  levaquin 750 x 5  days as has new area on L and needs f/u cxr on return

## 2016-01-14 NOTE — Assessment & Plan Note (Addendum)
FENO 01/14/2016  = 5 on dulera 100 2bid    - added pepcid hs/gerd diet due to am cough  01/14/2016 >>>  Will also rx with short course of perd but based on feno doubt there is much steroid resp airway issue here so no change in dulera needed.  Main aggravating issue is sinus dz > f/u at [facility] planned.  I had an extended discussion with the patient reviewing all relevant studies completed to date and  lasting 15 to 20 minutes of a 25 minute visit    Each maintenance medication was reviewed in detail including most importantly the difference between maintenance and prns and under what circumstances the prns are to be triggered using an action plan format that is not reflected in the computer generated alphabetically organized AVS.    Please see instructions for details which were reviewed in writing and the patient given a copy highlighting the part that I personally wrote and discussed at today's ov.

## 2016-01-14 NOTE — Progress Notes (Signed)
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   HPI 10/19/2012 1st pulmonary eval cc cough each am > minimal clear never bloody on zmax 500 tiw and pulmicort /xopenex bid and singulair and no limiting sob at time of initial ov. rec Continue nebulizer twice daily for now with budesonide and xopenex Stop saline for now Use proaire as needed if short of breath up to 4 hours but the less you use it the better it will work. Levaquin 750 x 5 days if needed if mucus gets nasty  03/20/2014 f/u ov/Chad Osborne re: persistent cough/ bronchiectasis Chief Complaint  Patient presents with  . Acute Visit    Pt c/o increased cough and chest pressure x 5 days. He c/o "horrible sinus headache"- since this am. He states has noticed thick, brown nasal discharge.    he had responded well to augmentin x 2 cycles then abruptly ill with onset of sinus congestion first, not following up with ent or using nasal hygiene (netipot) chronically or with excac.  No response to mucinex d   rec zithromax 500 three times a week indefinitely  augmentin x 21 days See Dr Redmond Baseman before you finish the Augmentin  Please remember to go to the  x-ray department     06/27/2014 f/u ov/Chad Osborne re: bronchiectasis improved symptoms on zmax 500 tiw /dulera 100 2bid  Chief Complaint  Patient presents with  . Follow-up    Pt states doing well today and "no more cough than the usual"  no new co's today.   rare need for saba  Never f/u with Redmond Baseman and some persistent nasal congestion    Not limited by breathing from desired activities   rec No change rx    11/10/2014  Acute  ov/Chad Osborne re: bronchiectasis flare maint on zmax 500 tiw and dulera 100 2bid/ singulair  Chief Complaint  Patient presents with  . Acute Visit    C/o prod cough (thick yellow phlem), chest tx, increase SOB   acutely worse x one week assoc with nasal congesiton/ ear pan on L / doe bu nto at rest/ only using saba rarely despite flare/ comfortable at rest rec Augmentin 875 mg take one pill twice daily  X 10 days.  Set up follow with ent > [facility]        01/14/2016  f/u ov/Chad Osborne re: bronchiectasis/ maint rx dulera 100 2 bid/ zmax  Chief Complaint  Patient presents with  . Acute Visit    Increased cough at night for the past month-yellow sputum. He also c/o waking up with chest tightness and SOB in the am- goes away after approx 1 hour and then comes back in the evening.   symptoms worse each am but don't wake him up    Continues with lots on nasal/ ear/ sinus congestion f/u at [facility] but no improvement to date    No obvious day to day or daytime variabilty or  cp , subjective wheeze overt   hb symptoms. No unusual exp hx or h/o childhood pna/ asthma or knowledge of premature birth.  Sleeping ok without nocturnal  or early am  exacerbation  of respiratory  c/o's or need for noct saba. Also denies any obvious fluctuation of symptoms with weather or environmental changes or other aggravating or alleviating factors except as outlined above   Current Medications, Allergies, Complete Past Medical History, Past Surgical History, Family History, and Social History were reviewed in [employer] record.  ROS  The following are not active complaints unless bolded sore throat, dysphagia, dental problems, itching, sneezing,  nasal congestion or excess/ purulent secretions, B ear aches,   fever, chills, sweats, unintended wt loss, pleuritic or exertional cp, hemoptysis,  orthopnea pnd or leg swelling, presyncope, palpitations, heartburn, abdominal pain, anorexia, nausea, vomiting, diarrhea  or  change in bowel or urinary habits, change in stools or urine, dysuria,hematuria,  rash, arthralgias, visual complaints, headache, numbness weakness or ataxia or problems with walking or coordination,  change in mood/affect or memory.              Objective:   Physical Exam  amb wm nad  06/30/2013      166>  08/05/13  168 > 09/21/2013 159> 02/17/2014  165 > 03/20/2014 160> 06/27/2014 152  > 11/10/2014  152 > 01/14/2016   166     06/16/13 170 lb 12.8 oz (77.474 kg)  02/02/13 168 lb 3.2 oz (76.295 kg)  01/18/13 165 lb (74.844 kg)         HEENT: nl dentition,  and nl orophanx. Nl external ear canals without cough reflex,  Mucopurulent secretions on r with turbinate swelling bilaterally     NECK :  without JVD/Nodes/TM/ nl carotid upstrokes bilaterally   LUNGS: no acc muscle use,  insp and exp rhonchi bilaterally s true wheeze or cough on insp/ exp   CV:  RRR  no s3 or murmur or increase in P2, no edema   ABD:  soft and nontender with nl excursion in the supine position. No bruits or organomegaly, bowel sounds nl  MS:  warm without deformities, calf tenderness, cyanosis or clubbing     CXR PA and Lateral:   01/14/2016 :    I personally reviewed images and agree with radiology impression as follows:    Chronic bronchitic changes. New subtle increased density peripherally in the left lower lung may reflect pneumonia. Followup PA and lateral chest X-ray is recommended in 3-4 weeks following trial of antibiotic therapy to ensure resolution and exclude underlying malignancy.      Assessment & Plan:

## 2016-01-15 ENCOUNTER — Telehealth: Payer: Self-pay | Admitting: *Deleted

## 2016-01-15 MED ORDER — LEVOFLOXACIN 750 MG PO TABS: 750.0000 mg | ORAL_TABLET | Freq: Every day | ORAL | 0 refills | Status: DC

## 2016-01-15 NOTE — Progress Notes (Signed)
See phone note dated 8/29

## 2016-01-15 NOTE — Progress Notes (Signed)
LMTCB

## 2016-01-15 NOTE — Telephone Encounter (Signed)
LMTCB

## 2016-01-15 NOTE — Telephone Encounter (Signed)
Spoke with pt and notified of results per Dr. Melvyn Novas. Pt verbalized understanding and denied any questions. Rx for levaquin was sent to pharm and pt understands to keep f/u

## 2016-01-15 NOTE — Telephone Encounter (Signed)
-----   Message from Tanda Rockers, MD sent at 01/15/2016  6:25 AM EDT ----- After further review of cxr/ hx rec levaquin 750 x 5  days as has new area on L may be early pna and needs f/u cxr on return

## 2016-01-15 NOTE — Assessment & Plan Note (Signed)
01/14/2016  Assoc with new peripheral density L > levaquin 750 x5 days and rec cxr in 6 weeks

## 2016-02-08 ENCOUNTER — Telehealth: Payer: Self-pay | Admitting: Internal Medicine

## 2016-02-08 MED ORDER — AMOXICILLIN-POT CLAVULANATE 875-125 MG PO TABS: 1.0000 | ORAL_TABLET | Freq: Two times a day (BID) | ORAL | 0 refills | Status: DC

## 2016-02-08 MED ORDER — PREDNISONE 10 MG PO TABS: ORAL_TABLET | ORAL | 0 refills | Status: DC

## 2016-02-08 NOTE — Telephone Encounter (Signed)
Called spoke with patient, advised of all of MW's recommendations as dictated below.  Pt voiced his understanding. Rx sent to verified pharmacy 2 week follow up scheduled for 10.5.17 @ 1145 (cancelled the 10.9.17 appt because pt will be out of town that day)  Nothing further needed; will sign off

## 2016-02-08 NOTE — Telephone Encounter (Signed)
Augmentin 875 mg take one pill twice daily  X 10 days - take at breakfast and supper with large glass of water.  It would help reduce the usual side effects (diarrhea and yeast infections) if you ate cultured yogurt at lunch. Then ov 2 weeks  Prednisone 10 mg take  4 each am x 2 days,   2 each am x 2 days,  1 each am x 2 days and stop

## 2016-02-08 NOTE — Telephone Encounter (Signed)
Per 01/14/16 OV: Pepcid 20 mg ac at bedtime x 6 weeks  GERD (REFLUX) is an extremely common cause of respiratory symptoms just like yours , many times with no obvious heartburn at all.  It can be treated with medication, but also with lifestyle changes including elevation of the head of your bed (ideally with 6 inch bed blocks), Smoking cessation, avoidance of late meals, excessive alcohol, and avoid fatty foods, chocolate, peppermint, colas, red wine, and acidic juices such as orange juice.  NO MINT OR MENTHOL PRODUCTS SO NO COUGH DROPS  USE SUGARLESS CANDY INSTEAD (Jolley ranchers or Stover's or Life Savers) or even ice chips will also do - the key is to swallow to prevent all throat clearing. NO OIL BASED VITAMINS - use powdered substitutes.  Prednisone 10 mg take 4 each am x 2 days, 2 each am x 2 days, 1 each am x 2 days and stop  Use the flutter valve as much as you can  Plan A = Automatic = Dulera 100 Take 2 puffs first thing in am and then another 2 puffs about 12 hours later.  Plan B = Backup Only use your albuterol as a rescue medication to be used if you can't catch your breath by resting or doing a relaxed purse lip breathing pattern.  - The less you use it, the better it will work when you need it. - Ok to use the inhaler up to 2 puffs every 4 hours if you must but call for appointment if use goes up over your usual need - Don't leave home without it !! (think of it like the spare tire for your car)  Please remember to go to the x-ray department downstairs for your tests - we will call you with the results when they are available.  Please schedule a follow up office visit in 6 weeks, call sooner if needed - bring inhalers with you  ----  Spoke with pt. C/o prod cough thick phlem (yellow phlem), chest tightness, wheezing. SOB no worse. Reports it taste like infection. He has finished his ABX. Please advise MW thanks

## 2016-02-21 ENCOUNTER — Ambulatory Visit (INDEPENDENT_AMBULATORY_CARE_PROVIDER_SITE_OTHER)
Admission: RE | Admit: 2016-02-21 | Discharge: 2016-02-21 | Disposition: A | Discharge status: Home / Self Care | Payer: Managed Care, Other (non HMO) | Source: Ambulatory Visit | Attending: Internal Medicine | Admitting: Internal Medicine

## 2016-02-21 ENCOUNTER — Ambulatory Visit (INDEPENDENT_AMBULATORY_CARE_PROVIDER_SITE_OTHER): Payer: Managed Care, Other (non HMO) | Admitting: Internal Medicine

## 2016-02-21 ENCOUNTER — Encounter: Payer: Self-pay | Admitting: Internal Medicine

## 2016-02-21 VITALS — BP 114/74 | HR 83 | Ht 68.0 in | Wt 167.8 lb

## 2016-02-21 DIAGNOSIS — J479 Bronchiectasis, uncomplicated: Secondary | ICD-10-CM

## 2016-02-21 MED ORDER — LEVOFLOXACIN 500 MG PO TABS: 500.0000 mg | ORAL_TABLET | Freq: Every day | ORAL | 11 refills | Status: DC

## 2016-02-21 NOTE — Patient Instructions (Addendum)
When needed > levaquin 500 mg daily x 10 days   Please remember to go to the  x-ray department downstairs for your tests - we will call you with the results when they are available.  Please schedule a follow up visit in 3 months but call sooner if needed

## 2016-02-21 NOTE — Progress Notes (Signed)
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    History of Present Illness  10/19/2012 1st pulmonary eval cc cough each am > minimal clear never bloody on zmax 500 tiw and pulmicort /xopenex bid and singulair and no limiting sob at time of initial ov. rec Continue nebulizer twice daily for now with budesonide and xopenex Stop saline for now Use proaire as needed if short of breath up to 4 hours but the less you use it the better it will work. Levaquin 750 x 5 days if needed if mucus gets nasty  03/20/2014 f/u ov/Journee Bobrowski re: persistent cough/ bronchiectasis Chief Complaint  Patient presents with  . Acute Visit    Pt c/o increased cough and chest pressure x 5 days. He c/o "horrible sinus headache"- since this am. He states has noticed thick, brown nasal discharge.    he had responded well to augmentin x 2 cycles then abruptly ill with onset of sinus congestion first, not following up with ent or using nasal hygiene (netipot) chronically or with excac.  No response to mucinex d   rec zithromax 500 three times a week indefinitely  augmentin x 21 days See Dr Redmond Baseman before you finish the Augmentin  Please remember to go to the  x-ray department     06/27/2014 f/u ov/Rainen Vanrossum re: bronchiectasis improved symptoms on zmax 500 tiw /dulera 100 2bid  Chief Complaint  Patient presents with  . Follow-up    Pt states doing well today and "no more cough than the usual"  no new co's today.   rare need for saba  Never f/u with Redmond Baseman and some persistent nasal congestion    Not limited by breathing from desired activities   rec No change rx     11/10/2014  Acute ov/Eliana Lueth re: bronchiectasis flare maint on zmax 500 tiw and dulera 100 2bid/ singulair  Chief Complaint  Patient presents with  . Acute Visit    C/o prod cough (thick yellow phlem), chest tx, increase SOB   acutely worse x one week assoc with nasal congesiton/ ear pan on L / doe bu nto at rest/ only using saba rarely despite flare/ comfortable at rest rec Augmentin 875 mg take one pill twice daily  X 10 days.  Set up follow with ent > [facility]        01/14/2016  f/u ov/Icelynn Onken re: bronchiectasis/ maint rx dulera 100 2 bid/ zmax  Chief Complaint  Patient presents with  . Acute Visit    Increased cough at night for the past month-yellow sputum. He also c/o waking up with chest tightness and SOB in the am- goes away after approx 1 hour and then comes back in the evening.   symptoms worse each am but don't wake him up    Continues with lots on nasal/ ear/ sinus congestion f/u at [facility] but no improvement to date  rec Pepcid 20 mg ac at bedtime x 6 weeks  GERD diet  Prednisone 10 mg take  4 each am x 2 days,   2 each am x 2 days,  1 each am x 2  days and stop  Use the flutter valve as much as you can  Plan A = Automatic =  Dulera 100 Take 2 puffs first thing in am and then another 2 puffs about 12 hours later.  Plan B = Backup Only use your albuterol as a rescue medication  Please schedule a follow up office visit in 6 weeks, call sooner if needed  - bring inhalers with you  Late add:  levaquin 750 x 5  days as has new area on L and needs f/u cxr on return    02/08/16 : Augmentin 875 mg take one pill twice daily  X 10 days - take at breakfast and supper with large glass of water.  It would help reduce the usual side effects (diarrhea and yeast infections) if you ate cultured yogurt at lunch. Then ov 2 weeks Prednisone 10 mg take  4 each am x 2 days,   2 each am x 2 days,  1 each am x 2 days and stop     02/21/2016  f/u ov/Jackie Littlejohn re: bronchiectasis f/u ? Pna  Chief Complaint   Patient presents with  . Follow-up    Needs CXR today. Pt states that breathing is okay. Still coughing, sputum bright yellow. Cough in morning has lessened but [place] still present. Pt states that his breathing is not back at baseline.     Feels levaquin usually works the best but needs longer rx/ augmentin not as effective/ still has unresolved sinus dz per [facility] ent   No obvious day to day or daytime variabilty or  cp , subjective wheeze overt   hb symptoms. No unusual exp hx or h/o childhood pna/ asthma or knowledge of premature birth.  Sleeping ok without nocturnal  or early am exacerbation  of respiratory  c/o's or need for noct saba. Also denies any obvious fluctuation of symptoms with weather or environmental changes or other aggravating or alleviating factors except as outlined above   Current Medications, Allergies, Complete Past Medical History, Past Surgical History, Family History, and Social History were reviewed in [employer] record.  ROS  The following are not active complaints unless bolded sore throat, dysphagia, dental problems, itching, sneezing,  nasal congestion or excess/ purulent secretions, B ear aches,   fever, chills, sweats, unintended wt loss, pleuritic or exertional cp, hemoptysis,  orthopnea pnd or leg swelling, presyncope, palpitations, heartburn, abdominal pain, anorexia, nausea, vomiting, diarrhea  or change in bowel or urinary habits, change in stools or urine, dysuria,hematuria,  rash, arthralgias, visual complaints, headache, numbness weakness or ataxia or problems with walking or coordination,  change in mood/affect or memory.              Objective:   Physical Exam  amb wm nad / vital signs reviewed  - note sats 95% RA on arrival   06/30/2013      166>  08/05/13  168 > 09/21/2013 159> 02/17/2014  165 > 03/20/2014 160> 06/27/2014 152  > 11/10/2014  152 > 01/14/2016   166  > 02/21/2016     06/16/13 170 lb 12.8 oz (77.474 kg)  02/02/13 168 lb  3.2 oz (76.295 kg)  01/18/13 165 lb (74.844 kg)         HEENT: nl dentition,  and nl orophanx. Nl external ear canals without cough reflex,  Mucopurulent secretions on r with turbinate swelling bilaterally     NECK :  without JVD/Nodes/TM/ nl carotid upstrokes bilaterally   LUNGS: no acc  muscle use,   insp pops, squeaks and exp rhonchi bilaterally   CV:  RRR  no s3 or murmur or increase in P2, no edema   ABD:  soft and nontender with nl excursion in the supine position. No bruits or organomegaly, bowel sounds nl  MS:  warm without deformities, calf tenderness, cyanosis or clubbing      CXR PA and Lateral:   02/21/2016 :    I personally reviewed images and agree with radiology impression as follows:   Chronic bronchitic changes and atelectasis versus scarring LEFT lower lobe.  No new abnormalities.       Assessment & Plan:

## 2016-02-21 NOTE — Progress Notes (Signed)
Spoke with pt and notified of results per Dr. Wert. Pt verbalized understanding and denied any questions. 

## 2016-02-22 ENCOUNTER — Encounter: Payer: Self-pay | Admitting: Internal Medicine

## 2016-02-22 NOTE — Assessment & Plan Note (Signed)
-   Flutter valve 02/02/13  - Prevnar given 06/16/2013  - started dulera 100 2bid 06/16/2013 > -  Alpha one AT 06/30/2013 >  MM - Sinus CT rec 06/30/2013 >  Diffuse chronic sinusitis changes  - PFT's 08/05/2013  [facility]  - resumed tiw zmax 500 09/29/13 > d/c ? When by pt > resumed 03/20/14 - levaquin 500 x 10 days prn flare rec 02/21/2016   Improved though still concerned re unresolved sinus dz contributing to bronchiectasis flares/ defer rx to [facility]    I had an extended discussion with the patient reviewing all relevant studies completed to date and  lasting 15 to 20 minutes of a 25 minute visit    Discussed in detail all the  indications, usual  risks and alternatives  relative to the benefits with patient who agrees to proceed with maint zmax and prn levaquin x 10 days  Each maintenance medication was reviewed in detail including most importantly the difference between maintenance and prns and under what circumstances the prns are to be triggered using an action plan format that is not reflected in the computer generated alphabetically organized AVS.    Please see instructions for details which were reviewed in writing and the patient given a copy highlighting the part that I personally wrote and discussed at today's ov.

## 2016-02-25 ENCOUNTER — Ambulatory Visit: Payer: Managed Care, Other (non HMO) | Admitting: Internal Medicine

## 2016-02-27 IMAGING — CR DG CHEST 2V
2 series · 2 of 2 positions shown · non-contrast
Comparison: 09/21/2013.

CLINICAL DATA: Bronchiectasis.  Chest tightness.  Cough.

EXAM:
CHEST  2 VIEW

[view not recorded (1 of 2)]
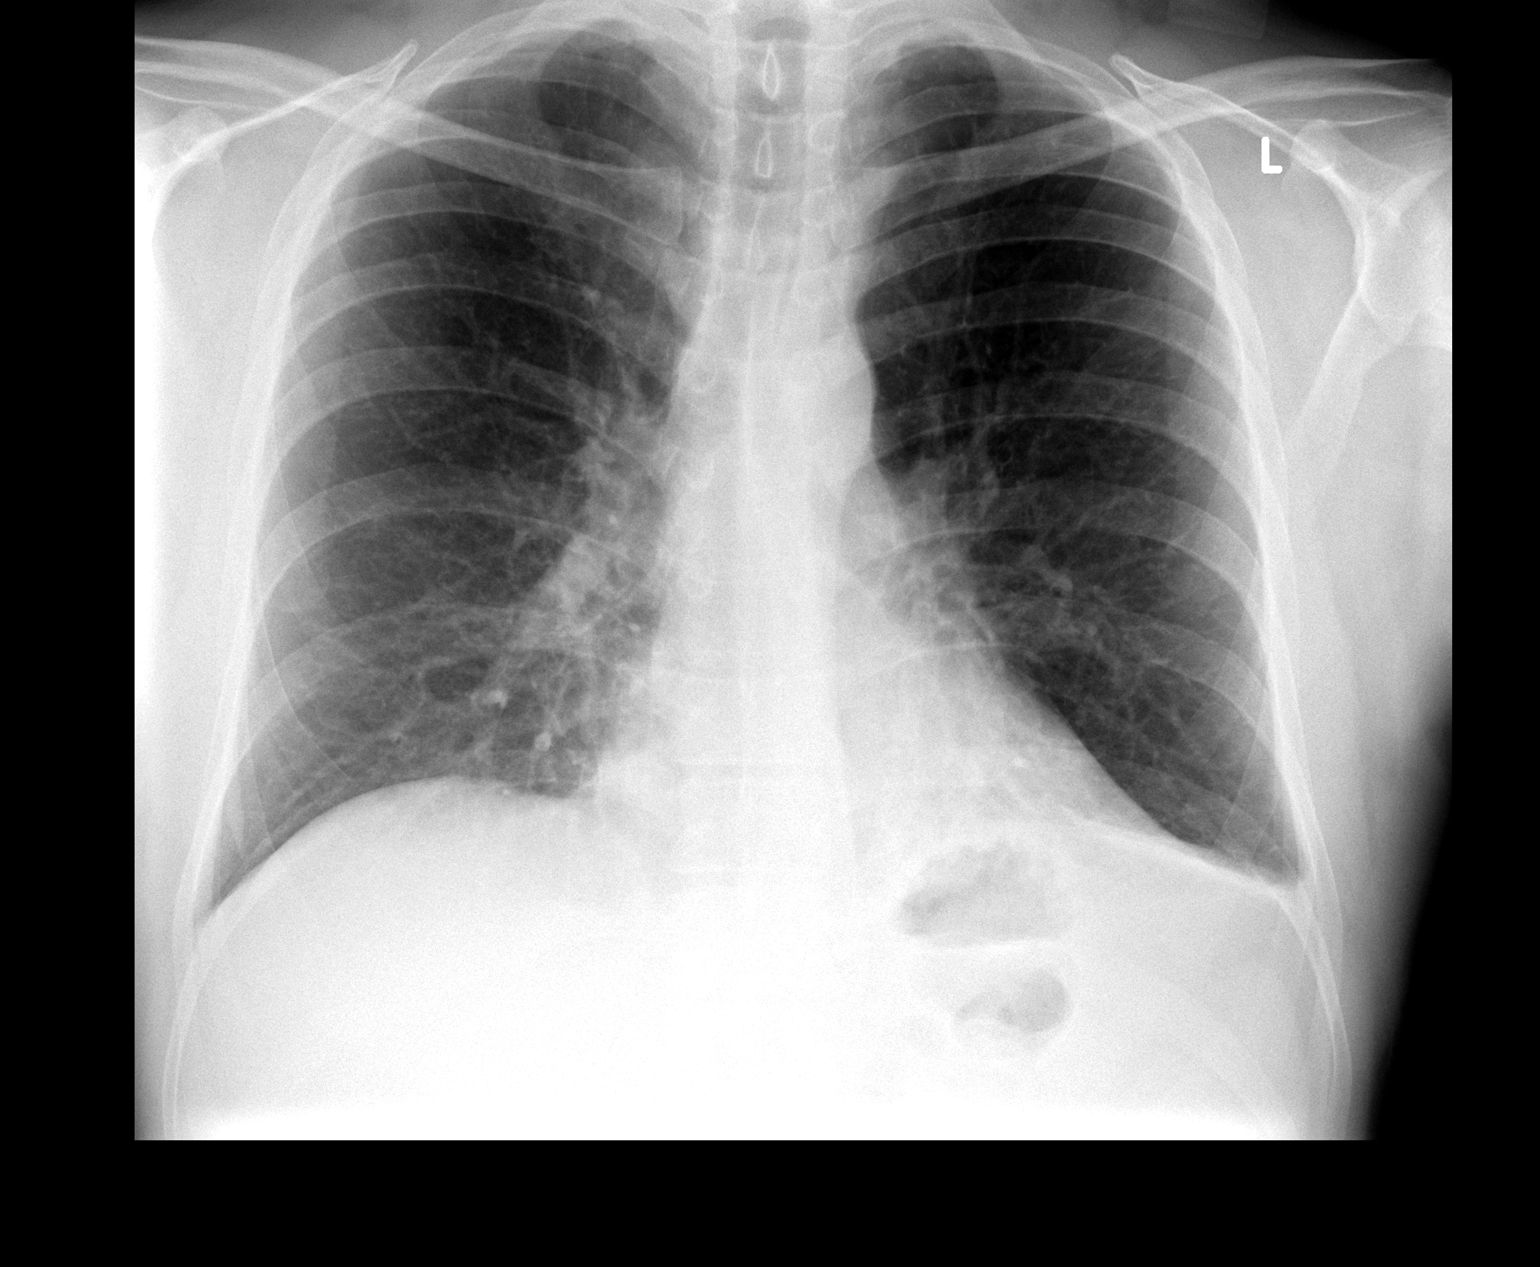

[view not recorded (2 of 2)]
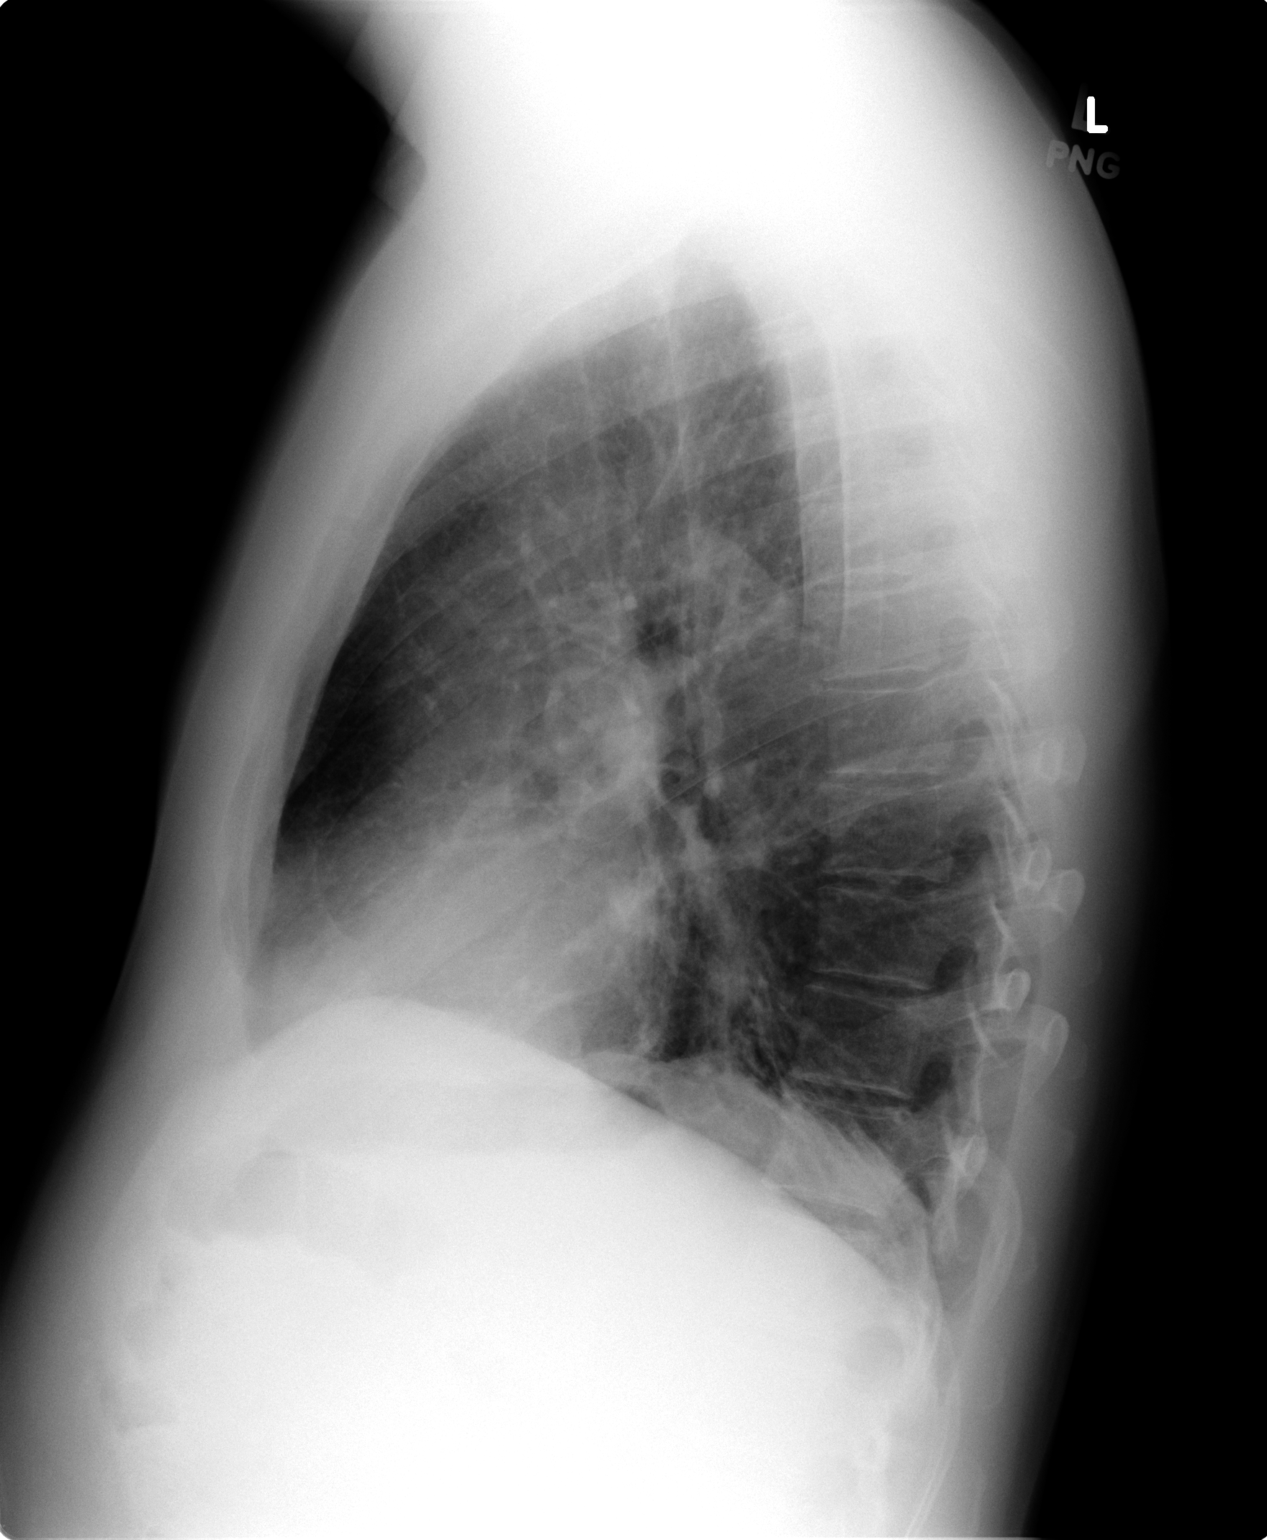

[2 of 2 positions shown; findings below may reference images not displayed]

FINDINGS: Mediastinum and hilar structures normal. Bibasilar atelectasis
and/or scarring. Chronic infiltrate left lower lobe noted.
Underlying left lower lobe bronchiectasis may be present. Stable
blunting left costophrenic angle consistent with pleural scarring.
Heart size and pulmonary vascularity normal. No pneumothorax. No
acute bony abnormality .
IMPRESSION: Bibasilar atelectasis and/or scarring with chronic left lower lobe
infiltrate and probable bronchiectasis . No acute abnormality .

## 2016-03-12 ENCOUNTER — Other Ambulatory Visit: Payer: Self-pay | Admitting: Internal Medicine

## 2016-03-12 DIAGNOSIS — R059 Cough, unspecified: Secondary | ICD-10-CM

## 2016-03-12 DIAGNOSIS — R05 Cough: Secondary | ICD-10-CM

## 2016-03-12 MED ORDER — FAMOTIDINE 20 MG PO TABS: ORAL_TABLET | ORAL | 3 refills | Status: DC

## 2016-04-14 ENCOUNTER — Other Ambulatory Visit: Payer: Self-pay | Admitting: Internal Medicine

## 2016-05-29 ENCOUNTER — Ambulatory Visit: Payer: Managed Care, Other (non HMO) | Admitting: Internal Medicine

## 2016-06-03 ENCOUNTER — Encounter: Payer: Self-pay | Admitting: Internal Medicine

## 2016-06-03 ENCOUNTER — Ambulatory Visit (INDEPENDENT_AMBULATORY_CARE_PROVIDER_SITE_OTHER): Payer: Managed Care, Other (non HMO) | Admitting: Internal Medicine

## 2016-06-03 VITALS — BP 132/80 | HR 103 | Ht 67.5 in | Wt 167.0 lb

## 2016-06-03 DIAGNOSIS — Z23 Encounter for immunization: Secondary | ICD-10-CM

## 2016-06-03 DIAGNOSIS — J479 Bronchiectasis, uncomplicated: Secondary | ICD-10-CM

## 2016-06-03 MED ORDER — BUDESONIDE-FORMOTEROL FUMARATE 80-4.5 MCG/ACT IN AERO: INHALATION_SPRAY | RESPIRATORY_TRACT | 11 refills | Status: DC

## 2016-06-03 MED ORDER — BUDESONIDE-FORMOTEROL FUMARATE 80-4.5 MCG/ACT IN AERO: INHALATION_SPRAY | RESPIRATORY_TRACT | 3 refills | Status: DC

## 2016-06-03 NOTE — Progress Notes (Signed)
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    History of Present Illness  10/19/2012 1st pulmonary eval cc cough each am > minimal clear never bloody on zmax 500 tiw and pulmicort /xopenex bid and singulair and no limiting sob at time of initial ov. rec Continue nebulizer twice daily for now with budesonide and xopenex Stop saline for now Use proaire as needed if short of breath up to 4 hours but the less you use it the better it will work. Levaquin 750 x 5 days if needed if mucus gets nasty  03/20/2014 f/u ov/Chad Osborne re: persistent cough/ bronchiectasis Chief Complaint  Patient presents with  . Acute Visit    Pt c/o increased cough and chest pressure x 5 days. He c/o "horrible sinus headache"- since this am. He states has noticed thick, brown nasal discharge.    he had responded well to augmentin x 2 cycles then abruptly ill with onset of sinus congestion first, not following up with ent or using nasal hygiene (netipot) chronically or with excac.  No response to mucinex d   rec zithromax 500 three times a week indefinitely  augmentin x 21 days See Dr Redmond Baseman before you finish the Augmentin  Please remember to go to the  x-ray department     06/27/2014 f/u ov/Chad Osborne re: bronchiectasis improved symptoms on zmax 500 tiw /dulera 100 2bid  Chief Complaint  Patient presents with  . Follow-up    Pt states doing well today and "no more cough than the usual"  no new co's today.   rare need for saba  Never f/u with Redmond Baseman and some persistent nasal congestion    Not limited by breathing from desired activities   rec No change rx     11/10/2014  Acute ov/Chad Osborne re: bronchiectasis flare maint on zmax 500 tiw and dulera 100 2bid/ singulair  Chief Complaint  Patient presents with  . Acute Visit    C/o prod cough (thick yellow phlem), chest tx, increase SOB   acutely worse x one week assoc with nasal congesiton/ ear pan on L / doe bu nto at rest/ only using saba rarely despite flare/ comfortable at rest rec Augmentin 875 mg take one pill twice daily  X 10 days.  Set up follow with ent > [facility]        01/14/2016  f/u ov/Chad Osborne re: bronchiectasis/ maint rx dulera 100 2 bid/ zmax  Chief Complaint  Patient presents with  . Acute Visit    Increased cough at night for the past month-yellow sputum. He also c/o waking up with chest tightness and SOB in the am- goes away after approx 1 hour and then comes back in the evening.   symptoms worse each am but don't wake him up    Continues with lots on nasal/ ear/ sinus congestion f/u at [facility] but no improvement to date  rec Pepcid 20 mg ac at bedtime x 6 weeks  GERD diet  Prednisone 10 mg take  4 each am x 2 days,   2 each am x 2 days,  1 each am x 2  days and stop  Use the flutter valve as much as you can  Plan A = Automatic =  Dulera 100 Take 2 puffs first thing in am and then another 2 puffs about 12 hours later.  Plan B = Backup Only use your albuterol as a rescue medication  Please schedule a follow up office visit in 6 weeks, call sooner if needed  - bring inhalers with you  Late add:  levaquin 750 x 5  days as has new area on L and needs f/u cxr on return    02/08/16 : Augmentin 875 mg take one pill twice daily  X 10 days - take at breakfast and supper with large glass of water.  It would help reduce the usual side effects (diarrhea and yeast infections) if you ate cultured yogurt at lunch. Then ov 2 weeks Prednisone 10 mg take  4 each am x 2 days,   2 each am x 2 days,  1 each am x 2 days and stop     02/21/2016  f/u ov/Chad Osborne re: bronchiectasis f/u ? Pna  Chief Complaint   Patient presents with  . Follow-up    Needs CXR today. Pt states that breathing is okay. Still coughing, sputum bright yellow. Cough in morning has lessened but [place] still present. Pt states that his breathing is not back at baseline.     Feels levaquin usually works the best but needs longer rx/ augmentin not as effective/ still has unresolved sinus dz per [facility] ent rec When needed > levaquin 500 mg daily x 10 days  rec When needed > levaquin 500 mg daily x 10 days    06/03/2016  f/u ov/Chad Osborne re: bronchiecatasis/maint rx dulera 100 2bid zmax 500 tiw/ prn levaquin 500 x 10 days last took it  > 6 weeks prior to Chad Osborne Complaint  Patient presents with  . Follow-up    Breathing is overall doing well. He has occ "very deep cough" with yellow sputum.     Not limited by breathing from desired activities   / no need for saba    No obvious day to day or daytime variabilty or  Cp or mucus plugs/ hemoptysis , subjective wheeze overt   hb symptoms. No unusual exp hx or h/o childhood pna/ asthma or knowledge of premature birth.  Sleeping ok without nocturnal  or early am exacerbation  of respiratory  c/o's or need for noct saba. Also denies any obvious fluctuation of symptoms with weather or environmental changes or other aggravating or alleviating factors except as outlined above   Current Medications, Allergies, Complete Past Medical History, Past Surgical History, Family History, and Social History were reviewed in [employer] record.  ROS  The following are not active complaints unless bolded sore throat, dysphagia, dental problems, itching, sneezing,  nasal congestion or excess/ purulent secretions, B ear aches,   fever, chills, sweats, unintended wt loss, pleuritic or exertional cp, hemoptysis,  orthopnea pnd or leg swelling, presyncope, palpitations, heartburn, abdominal pain, anorexia, nausea, vomiting, diarrhea  or change in bowel or urinary habits, change in stools or  urine, dysuria,hematuria,  rash, arthralgias, visual complaints, headache, numbness weakness or ataxia or problems with walking or coordination,  change in mood/affect or memory.              Objective:   Physical Exam  amb wm nad / vital signs reviewed  - note sats 96% RA on arrival   06/30/2013  166>  08/05/13  168 > 09/21/2013 159> 02/17/2014  165 > 03/20/2014 160> 06/27/2014 152  > 11/10/2014  152 > 01/14/2016   166  > 02/21/2016 > 06/03/2016  167     06/16/13 170 lb 12.8 oz (77.474 kg)  02/02/13 168 lb 3.2 oz (76.295 kg)  01/18/13 165 lb (74.844 kg)        HEENT: nl dentition,  and nl orophanx.  Severe scarring on R / Tympanic tube crusted over with wax   Mucopurulent secretions on r with turbinate swelling bilaterally     NECK :  without JVD/Nodes/TM/ nl carotid upstrokes bilaterally   LUNGS: no acc muscle use,   insp and exp rhonchi bilaterally   CV:  RRR  no s3 or murmur or increase in P2, no edema   ABD:  soft and nontender with nl excursion in the supine position. No bruits or organomegaly, bowel sounds nl  MS:  warm without deformities, calf tenderness, cyanosis or clubbing      CXR PA and Lateral:   02/21/2016 :    I personally reviewed images and agree with radiology impression as follows:   Chronic bronchitic changes and atelectasis versus scarring LEFT lower lobe.  No new abnormalities.       Assessment & Plan:

## 2016-06-03 NOTE — Patient Instructions (Addendum)
Plan A = Automatic = change dulera to symbicort 80 Take 2 puffs first thing in am and then another 2 puffs about 12 hours later - call me if any problem with the coupon as the company will call the drugstore if needed     Plan B = Backup Only use your albuterol as a rescue medication to be used if you can't catch your breath by resting or doing a relaxed purse lip breathing pattern.  - The less you use it, the better it will work when you need it. - Ok to use the inhaler up to 2 puffs  every 4 hours if you must but call for appointment if use goes up over your usual need - Don't leave home without it !!  (think of it like the spare tire for your car)     Please schedule a follow up visit in 3 months but call sooner if needed

## 2016-06-04 NOTE — Assessment & Plan Note (Signed)
-   Flutter valve 02/02/13  - Prevnar given 06/16/2013  - started dulera 100 2bid 06/16/2013 > -  Alpha one AT 06/30/2013 >  MM - Sinus CT rec 06/30/2013 >  Diffuse chronic sinusitis changes  - PFT's 08/05/2013  [facility]  - resumed tiw zmax 500 09/29/13 > d/c ? When by pt > resumed 03/20/14 - levaquin 500 x 10 days prn flare rec 02/21/2016   - The proper method of use, as well as anticipated side effects, of a metered-dose inhaler are discussed and demonstrated to the patient. Improved effectiveness after extensive coaching during this visit to a level of approximately 90 % from a baseline of 75  % > continue hfa    Despite always having prominent rhonchi on exam not limited by doe and no severe flares on prn levaquin while on maint dulera 100/ zmax 500 tiw  Main challenge is med formulary > dulera no longer covered  I had an extended discussion with the patient reviewing all relevant studies completed to date and  lasting 15 to 20 minutes of a 25 minute visit on the following ongoing concerns:   Formulary restrictions will be an ongoing challenge for the forseable future and I would be happy to pick an alternative if the pt will first  provide me a list of them but pt  will need to return here for training for any new device that is required eg dpi vs hfa vs respimat.    In meantime we can always provide samples so the patient never runs out of any needed respiratory medications.  = symb 80 2bid and call if not covered  Each maintenance medication was reviewed in detail including most importantly the difference between maintenance and as needed and under what circumstances the prns are to be used.  Please see AVS for specific  Instructions which are unique to this visit and I personally typed out  which were reviewed in detail in writing with the patient and a copy provided.

## 2016-06-18 ENCOUNTER — Telehealth: Payer: Self-pay | Admitting: Internal Medicine

## 2016-06-18 MED ORDER — LEVOFLOXACIN 750 MG PO TABS: 750.0000 mg | ORAL_TABLET | Freq: Every day | ORAL | 0 refills | Status: DC

## 2016-06-18 NOTE — Telephone Encounter (Signed)
Just go ahead this time and take levaquin 750 x 7 days - only reason not to would be pain in achilles out of proportion to other pains

## 2016-06-18 NOTE — Telephone Encounter (Signed)
Called and spoke with pt and he is aware of rx that has been sent to his pharmacy here in town.  He stated that he will call and have [facility] [place].

## 2016-06-18 NOTE — Telephone Encounter (Signed)
Spoke with pt, who states after his OV with MW on 06-03-16. Pt developed bronchiectasis flare. Pt states he started levaquin and finished that on 06-16-16. Pt states he feels that the levaquin did help, however yesterday he started feeling bad again. Pt c/o prod cough with yellow mucus, foul sputum, knees are achy & wheezing X2w Pt is currently in [place] visiting a friend and would like recommendations. Pt request any Rx be sent to [facility], [place] [address].  MW please advise. Thanks.   Patient Instructions    Plan A = Automatic = change dulera to symbicort 80 Take 2 puffs first thing in am and then another 2 puffs about 12 hours later - call me if any problem with the coupon as the company will call the drugstore if needed     Plan B = Backup Only use your albuterol as a rescue medication to be used if you can't catch your breath by resting or doing a relaxed purse lip breathing pattern.  - The less you use it, the better it will work when you need it. - Ok to use the inhaler up to 2 puffs  every 4 hours if you must but call for appointment if use goes up over your usual need - Don't leave home without it !!  (think of it like the spare tire for your car)     Please schedule a follow up visit in 3 months but call sooner if needed

## 2016-07-19 ENCOUNTER — Other Ambulatory Visit: Payer: Self-pay | Admitting: Internal Medicine

## 2016-09-01 ENCOUNTER — Ambulatory Visit (INDEPENDENT_AMBULATORY_CARE_PROVIDER_SITE_OTHER): Payer: Managed Care, Other (non HMO) | Admitting: Internal Medicine

## 2016-09-01 ENCOUNTER — Encounter: Payer: Self-pay | Admitting: Internal Medicine

## 2016-09-01 ENCOUNTER — Telehealth: Payer: Self-pay | Admitting: Internal Medicine

## 2016-09-01 VITALS — BP 130/80 | HR 75 | Ht 67.5 in | Wt 166.8 lb

## 2016-09-01 DIAGNOSIS — J471 Bronchiectasis with (acute) exacerbation: Secondary | ICD-10-CM | POA: Diagnosis not present

## 2016-09-01 MED ORDER — PANTOPRAZOLE SODIUM 40 MG PO TBEC: 40.0000 mg | DELAYED_RELEASE_TABLET | Freq: Every day | ORAL | 2 refills | Status: AC

## 2016-09-01 NOTE — Telephone Encounter (Signed)
Pt aware to keep appt 

## 2016-09-01 NOTE — Telephone Encounter (Signed)
lmtcb for pt.  

## 2016-09-01 NOTE — Progress Notes (Signed)
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   History of Present Illness  10/19/2012 1st pulmonary eval cc cough each am > minimal clear never bloody on zmax 500 tiw and pulmicort /xopenex bid and singulair and no limiting sob at time of initial ov. rec Continue nebulizer twice daily for now with budesonide and xopenex Stop saline for now Use proaire as needed if short of breath up to 4 hours but the less you use it the better it will work. Levaquin 750 x 5 days if needed if mucus gets nasty  03/20/2014 f/u ov/Wert re: persistent cough/ bronchiectasis Chief Complaint  Patient presents with  . Acute Visit    Pt c/o increased cough and chest pressure x 5 days. He c/o "horrible sinus headache"- since this am. He states has noticed thick, brown nasal discharge.    he had responded well to augmentin x 2 cycles then abruptly ill with onset of sinus congestion first, not following up with ent or using nasal hygiene (netipot) chronically or with excac.  No response to mucinex d   rec zithromax 500 three times a week indefinitely  augmentin x 21 days See Dr Redmond Baseman before you finish the Augmentin  Please remember to go to the  x-ray department     06/27/2014 f/u ov/Wert re: bronchiectasis improved symptoms on zmax 500 tiw /dulera 100 2bid  Chief Complaint  Patient presents with  . Follow-up    Pt states doing well today and "no more cough than the usual"  no new co's today.   rare need for saba  Never f/u with Redmond Baseman and some persistent nasal congestion    Not limited by breathing from desired activities   rec No change rx     11/10/2014  Acute ov/Wert re: bronchiectasis flare maint on zmax 500 tiw and dulera 100 2bid/ singulair  Chief Complaint  Patient presents with  . Acute Visit    C/o prod cough (thick yellow phlem), chest tx, increase SOB   acutely worse x one week assoc with nasal congesiton/ ear pan on L / doe bu nto at rest/ only using saba rarely despite flare/ comfortable at rest rec Augmentin 875 mg take one pill twice daily  X 10 days.  Set up follow with ent > [facility]        01/14/2016  f/u ov/Wert re: bronchiectasis/ maint rx dulera 100 2 bid/ zmax  Chief Complaint  Patient presents with  . Acute Visit    Increased cough at night for the past month-yellow sputum. He also c/o waking up with chest tightness and SOB in the am- goes away after approx 1 hour and then comes back in the evening.   symptoms worse each am but don't wake him up    Continues with lots on nasal/ ear/ sinus congestion f/u at [facility] but no improvement to date  rec Pepcid 20 mg ac at bedtime x 6 weeks  GERD diet  Prednisone 10 mg take  4 each am x 2 days,   2 each am x 2 days,  1 each am x 2 days  and stop  Use the flutter valve as much as you can  Plan A = Automatic =  Dulera 100 Take 2 puffs first thing in am and then another 2 puffs about 12 hours later.  Plan B = Backup Only use your albuterol as a rescue medication  Please schedule a follow up office visit in 6 weeks, call sooner if needed  - bring inhalers with you  Late add:  levaquin 750 x 5  days as has new area on L and needs f/u cxr on return    02/08/16 : Augmentin 875 mg take one pill twice daily  X 10 days - take at breakfast and supper with large glass of water.  It would help reduce the usual side effects (diarrhea and yeast infections) if you ate cultured yogurt at lunch. Then ov 2 weeks Prednisone 10 mg take  4 each am x 2 days,   2 each am x 2 days,  1 each am x 2 days and stop     02/21/2016  f/u ov/Wert re: bronchiectasis f/u ? Pna  Chief Complaint   Patient presents with  . Follow-up    Needs CXR today. Pt states that breathing is okay. Still coughing, sputum bright yellow. Cough in morning has lessened but [place] still present. Pt states that his breathing is not back at baseline.     Feels levaquin usually works the best but needs longer rx/ augmentin not as effective/ still has unresolved sinus dz per [facility] ent rec When needed > levaquin 500 mg daily x 10 days  rec When needed > levaquin 500 mg daily x 10 days    06/03/2016  f/u ov/Wert re: bronchiecatasis/maint rx dulera 100 2bid zmax 500 tiw/ prn levaquin 500 x 10 days last took it  > 6 weeks prior to [place] Complaint  Patient presents with  . Follow-up    Breathing is overall doing well. He has occ "very deep cough" with yellow sputum.    Not limited by breathing from desired activities   / no need for saba  rec Plan A = Automatic = change dulera to symbicort 80 Take 2 puffs first thing in am and then another 2 puffs about 12 hours later - call me if any problem with the coupon as the company will call the drugstore if needed  Plan B = Backup Only use your albuterol as a rescue medication    09/01/2016  f/u ov/Wert re: bronchiectasis, mild flare  Chief Complaint  Patient presents with  . Follow-up    Pt c/o sinus pressure/HA, thick sputum, and chest tightness for the past wk. He started round of levaquin on 4/13.    [facility] and has lots of nasal symptoms  Not limited by breathing from desired activities    No obvious day to day or daytime variability or assoc excess/ purulent sputum or mucus plugs or hemoptysis or cp or chest tightness, subjective wheeze or overt   hb symptoms. No unusual exp hx or h/o childhood pna/ asthma or knowledge of premature birth.  Sleeping ok without nocturnal  or early am exacerbation  of respiratory  c/o's or need for noct saba. Also denies any obvious fluctuation of symptoms with weather or environmental changes or other aggravating  or alleviating factors except as outlined above   Current Medications, Allergies, Complete Past Medical History, Past Surgical History, Family History, and Social History were reviewed in [employer] record.  ROS  The following are not active complaints unless bolded sore throat, dysphagia, dental problems, itching, sneezing,  nasal congestion or excess/ purulent secretions, ear ache,   fever, chills, sweats, unintended wt loss, classically pleuritic or exertional cp,  orthopnea pnd or leg swelling, presyncope, palpitations, abdominal pain, anorexia, nausea, vomiting, diarrhea  or change in bowel or bladder habits, change in stools or urine, dysuria,hematuria,  rash, arthralgias, visual complaints, headache, numbness, weakness or ataxia or problems with walking or coordination,  change in mood/affect or memory.                   Objective:   Physical Exam  amb wm nad / vital signs reviewed  - note sats 96% RA on arrival   06/30/2013      166>  08/05/13  168 > 09/21/2013 159> 02/17/2014  165 > 03/20/2014 160> 06/27/2014 152  > 11/10/2014  152 > 01/14/2016   166  > 02/21/2016 > 06/03/2016  167 > 09/01/2016   166     06/16/13 170 lb 12.8 oz (77.474 kg)  02/02/13 168 lb 3.2 oz (76.295 kg)  01/18/13 165 lb (74.844 kg)        HEENT: nl dentition,  and nl orophanx.  Severe scarring on R with hole in TM  /  L Ear canal obst with wax    Mucopurulent secretions on R with turbinate swelling bilaterally  /    NECK :  without JVD/Nodes/TM/ nl carotid upstrokes bilaterally   LUNGS: no acc muscle use,   Min insp and exp rhonchi bilaterally   CV:  RRR  no s3 or murmur or increase in P2, no edema   ABD:  soft and nontender with nl excursion in the supine position. No bruits or organomegaly, bowel sounds nl  MS:  warm without deformities, calf tenderness, cyanosis or clubbing      CXR PA and Lateral:   02/21/2016 :    I personally reviewed images and agree with radiology  impression as follows:   Chronic bronchitic changes and atelectasis versus scarring LEFT lower lobe.     Assessment & Plan:

## 2016-09-01 NOTE — Patient Instructions (Signed)
Follow up with your ENT docs at [facility]  Any time you have a cough flare for any reason:  Pantoprazole (protonix) 40 mg   Take  30-60 min before first meal of the day and Pepcid (famotidine)  20 mg one @  bedtime until back to normal  Please schedule a follow up visit in 3 months but call sooner if needed

## 2016-09-01 NOTE — Telephone Encounter (Signed)
Yes keep appt

## 2016-09-01 NOTE — Telephone Encounter (Signed)
Pt scheduled for appt with Dr Melvyn Novas today at 1:45pm for 3 mo ROV Pt is currently taking abx for Bronchiectasis flare Pt wanting to know if Dr Melvyn Novas would like for him to keep his appt as scheduled today or reschedule for when he is better, off the abx and back to his baseline. Please advise Dr Melvyn Novas. Thanks.    Patient Instructions 06/03/16  Plan A = Automatic = change dulera to symbicort 80 Take 2 puffs first thing in am and then another 2 puffs about 12 hours later - call me if any problem with the coupon as the company will call the drugstore if needed   Plan B = Backup Only use your albuterol as a rescue medication to be used if you can't catch your breath by resting or doing a relaxed purse lip breathing pattern.  - The less you use it, the better it will work when you need it. - Ok to use the inhaler up to 2 puffs  every 4 hours if you must but call for appointment if use goes up over your usual need - Don't leave home without it !!  (think of it like the spare tire for your car)   Please schedule a follow up visit in 3 months but call sooner if needed

## 2016-09-02 NOTE — Assessment & Plan Note (Signed)
-   Prevnar given 06/16/2013  - started dulera 100 2bid 06/16/2013 > -  Alpha one AT 06/30/2013 >  MM - Sinus CT rec 06/30/2013 >  Diffuse chronic sinusitis changes  - PFT's 08/05/2013  [facility]  - resumed tiw zmax 500 09/29/13 > d/c ? When by pt > resumed 03/20/14 - levaquin 500 x 10 days prn flare rec 02/21/2016    Managing fine on present rx but needs more attention to sinus dz/ f/u at [facility] planned  Each maintenance medication was reviewed in detail including most importantly the difference between maintenance and as needed and under what circumstances the prns are to be used.  Please see AVS for specific  Instructions which are unique to this visit and I personally typed out  which were reviewed in detail in writing with the patient and a copy provided.

## 2016-09-19 ENCOUNTER — Telehealth: Payer: Self-pay | Admitting: Internal Medicine

## 2016-09-19 MED ORDER — PREDNISONE 10 MG PO TABS: ORAL_TABLET | ORAL | 0 refills | Status: DC

## 2016-09-19 MED ORDER — CEFDINIR 300 MG PO CAPS: 300.0000 mg | ORAL_CAPSULE | Freq: Two times a day (BID) | ORAL | 0 refills | Status: DC

## 2016-09-19 NOTE — Telephone Encounter (Signed)
Pt aware of MW's recommendations and voiced his understanding. Pt states he is using ventolin 3-4x daily, he typically doesn't have to use ventolin daily.  Rx for omnicef and prednisone have been sent to preferred pharmacy. Nothing further needed.

## 2016-09-19 NOTE — Telephone Encounter (Signed)
Can try omnicef 300 mg bid x 10 days but if not improving by first of week needs ov  If more wheeze/ sob or need for albuterol over baseline then rec   take Prednisone 10 mg take  4 each am x 2 days,   2 each am x 2 days,  1 each am x 2 days and stop

## 2016-09-19 NOTE — Telephone Encounter (Signed)
Dr. Melvyn Novas  Please Advise-   Pt finished his course of Levaquin last Friday. Pt is concerned because he thinks he is having a flare up again. He sates you usually would prescribe amoxacillin but he wants to know if he can have something stronger than this and more of a longer course. Pt is c/o increase sob with exertion,productive cough with thick phlegm,slight wheezing,chest tightness. Pt is traveling and does not want to go untreated.    [facility]  No Known Allergies

## 2016-10-02 ENCOUNTER — Other Ambulatory Visit: Payer: Self-pay | Admitting: Internal Medicine

## 2016-12-01 ENCOUNTER — Encounter: Payer: Self-pay | Admitting: Internal Medicine

## 2016-12-01 ENCOUNTER — Ambulatory Visit (INDEPENDENT_AMBULATORY_CARE_PROVIDER_SITE_OTHER): Payer: Managed Care, Other (non HMO) | Admitting: Internal Medicine

## 2016-12-01 VITALS — BP 110/74 | HR 66 | Ht 67.5 in | Wt 162.0 lb

## 2016-12-01 DIAGNOSIS — J479 Bronchiectasis, uncomplicated: Secondary | ICD-10-CM

## 2016-12-01 MED ORDER — BUDESONIDE-FORMOTEROL FUMARATE 160-4.5 MCG/ACT IN AERO: 2.0000 | INHALATION_SPRAY | Freq: Two times a day (BID) | RESPIRATORY_TRACT | 11 refills | Status: DC

## 2016-12-01 MED ORDER — MOMETASONE FURO-FORMOTEROL FUM 200-5 MCG/ACT IN AERO: 2.0000 | INHALATION_SPRAY | Freq: Two times a day (BID) | RESPIRATORY_TRACT | 0 refills | Status: DC

## 2016-12-01 NOTE — Patient Instructions (Addendum)
Plan A = Automatic = symbiocrt 160 (or dulera 200) Take 2 puffs first thing in am and then another 2 puffs about 12 hours later.     Plan B = Backup Only use your albuterol as a rescue medication to be used if you can't catch your breath by resting or doing a relaxed purse lip breathing pattern.  - The less you use it, the better it will work when you need it. - Ok to use the inhaler up to 2 puffs  every 4 hours if you must but call for appointment if use goes up over your usual need - Don't leave home without it !!  (think of it like the spare tire for your car)     Please schedule a follow up visit in 3 months but call sooner if needed with full pfts  On return

## 2016-12-01 NOTE — Progress Notes (Signed)
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   History of Present Illness  10/19/2012 1st pulmonary eval cc cough each am > minimal clear never bloody on zmax 500 tiw and pulmicort /xopenex bid and singulair and no limiting sob at time of initial ov. rec Continue nebulizer twice daily for now with budesonide and xopenex Stop saline for now Use proaire as needed if short of breath up to 4 hours but the less you use it the better it will work. Levaquin 750 x 5 days if needed if mucus gets nasty  03/20/2014 f/u ov/Chad Osborne re: persistent cough/ bronchiectasis Chief Complaint  Patient presents with  . Acute Visit    Pt c/o increased cough and chest pressure x 5 days. He c/o "horrible sinus headache"- since this am. He states has noticed thick, brown nasal discharge.    he had responded well to augmentin x 2 cycles then abruptly ill with onset of sinus congestion first, not following up with ent or using nasal hygiene (netipot) chronically or with excac.  No response to mucinex d   rec zithromax 500 three times a week indefinitely  augmentin x 21 days See Dr Redmond Baseman before you finish the Augmentin  Please remember to go to the  x-ray department        11/10/2014  Acute ov/Chad Osborne re: bronchiectasis flare maint on zmax 500 tiw and dulera 100 2bid/ singulair  Chief Complaint  Patient presents with  . Acute Visit    C/o prod cough (thick yellow phlem), chest tx, increase SOB   acutely worse x one week assoc with nasal congesiton/ ear pan on L / doe bu nto at rest/ only using saba rarely despite flare/ comfortable at  rest rec Augmentin 875 mg take one pill twice daily  X 10 days.  Set up follow with ent > [facility]        01/14/2016  f/u ov/Chad Osborne re: bronchiectasis/ maint rx dulera 100 2 bid/ zmax  Chief Complaint  Patient presents with  . Acute Visit    Increased cough at night for the past month-yellow sputum. He also c/o waking up with chest tightness and SOB in the am- goes away after approx 1 hour and then comes back in the evening.   symptoms worse each am but don't wake him up    Continues with lots on nasal/ ear/ sinus congestion f/u at [facility] but no improvement to date  rec Pepcid 20 mg ac at bedtime x 6 weeks  GERD diet  Prednisone 10 mg take  4 each am x 2 days,   2 each am x 2 days,  1 each am x 2 days and stop  Use the flutter valve as much as you can  Plan A = Automatic =  Dulera 100 Take 2 puffs first thing in am and then another 2 puffs about 12 hours later.  Plan B = Backup Only use your albuterol as a rescue medication  Please schedule a follow up office visit in 6 weeks, call sooner if needed  - bring inhalers with you  Late add:  levaquin  750 x 5  days as has new area on L and needs f/u cxr on return    02/08/16 : Augmentin 875 mg take one pill twice daily  X 10 days - take at breakfast and supper with large glass of water.  It would help reduce the usual side effects (diarrhea and yeast infections) if you ate cultured yogurt at lunch. Then ov 2 weeks Prednisone 10 mg take  4 each am x 2 days,   2 each am x 2 days,  1 each am x 2 days and stop     02/21/2016  f/u ov/Chad Osborne re: bronchiectasis f/u ? Pna  Chief Complaint  Patient presents with  . Follow-up    Needs CXR today. Pt states that breathing is okay. Still coughing, sputum bright yellow. Cough in morning has lessened but [place] still present. Pt states that his breathing is not back at baseline.     Feels levaquin usually works the best but needs longer rx/ augmentin not as effective/ still has unresolved sinus dz per [facility]  ent rec When needed > levaquin 500 mg daily x 10 days       09/01/2016  f/u ov/Chad Osborne re: bronchiectasis, mild flare  Chief Complaint  Patient presents with  . Follow-up    Pt c/o sinus pressure/HA, thick sputum, and chest tightness for the past wk. He started round of levaquin on 4/13.    [facility] and has lots of nasal symptoms  Not limited by breathing from desired activities   rec Follow up with your ENT docs at [facility] Any time you have a cough flare for any reason:  Pantoprazole (protonix) 40 mg   Take  30-60 min before first meal of the day and Pepcid (famotidine)  20 mg one @  bedtime until back to normal    12/01/2016  f/u ov/Chad Osborne re: bronchiectasis  Regular f/u on symb 80 2bid/ singuliar/ zmax daily and no need for levaquin Chief Complaint  Patient presents with  . Follow-up    Pt states overall doing well. He rarely uses his albuterol inahaler.     Discouraged by ent > nothing much else to offer and continues to have nasal obst symptoms   No obvious day to day or daytime variability or assoc excess/ purulent sputum or mucus plugs or hemoptysis or cp or chest tightness, subjective wheeze or overt sinus or hb symptoms. No unusual exp hx or h/o childhood pna/ asthma or knowledge of premature birth.  Sleeping ok without nocturnal  or early am exacerbation  of respiratory  c/o's or need for noct saba. Also denies any obvious fluctuation of symptoms with weather or environmental changes or other aggravating or alleviating factors except as outlined above   Current Medications, Allergies, Complete Past Medical History, Past Surgical History, Family History, and Social History were reviewed in [employer] record.  ROS  The following are not active complaints unless bolded sore throat, dysphagia, dental problems, itching, sneezing,  nasal congestion or excess/ purulent secretions, ear ache,   fever, chills, sweats, unintended wt loss, classically  pleuritic or exertional cp,  orthopnea pnd or leg swelling, presyncope, palpitations, abdominal pain, anorexia, nausea, vomiting, diarrhea  or change in bowel or bladder habits, change in stools or urine, dysuria,hematuria,  rash, arthralgias, visual complaints, headache, numbness, weakness or ataxia or problems with walking or coordination,  change in mood/affect or memory.  Objective:   Physical Exam  amb wm nad / vital signs reviewed  - note sats 95% RA on arrival   06/30/2013      166>  08/05/13  168 > 09/21/2013 159> 02/17/2014  165 > 03/20/2014 160> 06/27/2014 152  > 11/10/2014  152 > 01/14/2016   166  > 02/21/2016 > 06/03/2016  167 > 09/01/2016   166  >  12/01/2016    162     06/16/13 170 lb 12.8 oz (77.474 kg)  02/02/13 168 lb 3.2 oz (76.295 kg)  01/18/13 165 lb (74.844 kg)        HEENT: nl dentition,  and nl oropharynx  - nasal tone to voice .    Mod non-specific  turbinate swelling bilaterally       NECK :  without JVD/Nodes/TM/ nl carotid upstrokes bilaterally   LUNGS: no acc muscle use,  Moderately coarse insp and exp rhonchi bilaterally   CV:  RRR  no s3 or murmur or increase in P2, no edema   ABD:  soft and nontender with nl excursion in the supine position. No bruits or organomegaly, bowel sounds nl  MS:  warm without deformities, calf tenderness, cyanosis or clubbing      CXR PA and Lateral:   02/21/2016 :    I personally reviewed images and agree with radiology impression as follows:   Chronic bronchitic changes and atelectasis versus scarring LEFT lower lobe.        Assessment & Plan:

## 2016-12-03 DIAGNOSIS — J479 Bronchiectasis, uncomplicated: Secondary | ICD-10-CM | POA: Insufficient documentation

## 2016-12-03 NOTE — Assessment & Plan Note (Addendum)
-   Flutter valve 02/02/13  - Prevnar given 06/16/2013  - started dulera 100 2bid 06/16/2013 > -  Alpha one AT 06/30/2013 >  MM - Sinus CT rec 06/30/2013 >  Diffuse chronic sinusitis changes  - PFT's 08/05/2013  [facility]  - resumed tiw zmax 500 09/29/13 > d/c ? When by pt > resumed 03/20/14 - levaquin 500 x 10 days prn flare rec 02/21/2016 Spirometry 12/01/2016  FEV1 2.26 (60%)  Ratio 61  - 12/01/2016  After extensive coaching HFA effectiveness =    90% > try symb 160 2bid (or dulera 200)    He has significant airway dz on exam and by spirometry that may be amenable to trial of laba/ics   Each maintenance medication was reviewed in detail including most importantly the difference between maintenance and as needed and under what circumstances the prns are to be used.  Please see AVS for specific  Instructions which are unique to this visit and I personally typed out  which were reviewed in detail in writing with the patient and a copy provided.    F/u in 6 weeks with full pfts

## 2017-02-04 ENCOUNTER — Telehealth: Payer: Self-pay | Admitting: Internal Medicine

## 2017-02-04 NOTE — Telephone Encounter (Signed)
Called and spoke with pharmacist Pt needing PA for Symbicort 160  [facility] [phone]  Pt ID- [phone]  Called and spoke with rep at [facility]  They stated that he got off track with his 90 day supply of symbicort and can only get 3 inhalers per 75 days I was transferred to another dept to help with override- [phone]  Onalee Hua was req and now awaiting decision which I was told can take up to 3 days  Ref # is [id number] Will hold in my basket

## 2017-02-09 NOTE — Telephone Encounter (Signed)
Called [facility] and was advised they did override the rx and pt should be able to fill the rx with coverage within the next 4 days. Called and spoke to pt and informed him of this, he verbalized understanding. Advised pt to contact us if there are any issues. Pt verbalized understanding and denied any further questions or concerns at this time.

## 2017-03-03 ENCOUNTER — Encounter: Payer: Self-pay | Admitting: Internal Medicine

## 2017-03-03 ENCOUNTER — Ambulatory Visit (INDEPENDENT_AMBULATORY_CARE_PROVIDER_SITE_OTHER): Payer: Managed Care, Other (non HMO) | Admitting: Internal Medicine

## 2017-03-03 VITALS — BP 120/74 | HR 64 | Ht 67.0 in | Wt 165.0 lb

## 2017-03-03 DIAGNOSIS — Z23 Encounter for immunization: Secondary | ICD-10-CM

## 2017-03-03 DIAGNOSIS — J479 Bronchiectasis, uncomplicated: Secondary | ICD-10-CM | POA: Diagnosis not present

## 2017-03-03 LAB — PULMONARY FUNCTION TEST
DL/VA % pred: 117 %
DL/VA: 5.24 ml/min/mmHg/L
DLCO COR % PRED: 99 %
DLCO UNC: 29.69 ml/min/mmHg
DLCO cor: 28.22 ml/min/mmHg
DLCO unc % pred: 104 %
FEF 25-75 Post: 1.62 L/sec
FEF 25-75 Pre: 1.54 L/sec
FEF2575-%Change-Post: 5 %
FEF2575-%PRED-POST: 47 %
FEF2575-%Pred-Pre: 44 %
FEV1-%CHANGE-POST: 3 %
FEV1-%PRED-POST: 73 %
FEV1-%Pred-Pre: 71 %
FEV1-PRE: 2.64 L
FEV1-Post: 2.72 L
FEV1FVC-%Change-Post: 2 %
FEV1FVC-%Pred-Pre: 80 %
FEV6-%Change-Post: 1 %
FEV6-%PRED-POST: 90 %
FEV6-%PRED-PRE: 89 %
FEV6-POST: 4.12 L
FEV6-PRE: 4.08 L
FEV6FVC-%CHANGE-POST: 0 %
FEV6FVC-%PRED-POST: 102 %
FEV6FVC-%PRED-PRE: 101 %
FVC-%CHANGE-POST: 0 %
FVC-%PRED-POST: 88 %
FVC-%PRED-PRE: 88 %
FVC-POST: 4.17 L
FVC-PRE: 4.15 L
PRE FEV6/FVC RATIO: 98 %
Post FEV1/FVC ratio: 65 %
Post FEV6/FVC ratio: 99 %
Pre FEV1/FVC ratio: 64 %
RV % PRED: 133 %
RV: 2.36 L
TLC % PRED: 105 %
TLC: 6.7 L

## 2017-03-03 NOTE — Patient Instructions (Signed)
PFT done today. 

## 2017-03-03 NOTE — Progress Notes (Signed)
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   History of Present Illness  10/19/2012 1st pulmonary eval cc cough each am > minimal clear never bloody on zmax 500 tiw and pulmicort /xopenex bid and singulair and no limiting sob at time of initial ov. rec Continue nebulizer twice daily for now with budesonide and xopenex Stop saline for now Use proaire as needed if short of breath up to 4 hours but the less you use it the better it will work. Levaquin 750 x 5 days if needed if mucus gets nasty  03/20/2014 f/u ov/Tiquan Bouch re: persistent cough/ bronchiectasis Chief Complaint  Patient presents with  . Acute Visit    Pt c/o increased cough and chest pressure x 5 days. He c/o "horrible sinus headache"- since this am. He states has noticed thick, brown nasal discharge.    he had responded well to augmentin x 2 cycles then abruptly ill with onset of sinus congestion first, not following up with ent or using nasal hygiene (netipot) chronically or with excac.  No response to mucinex d   rec zithromax 500 three times a week indefinitely  augmentin x 21 days See Dr Redmond Baseman before you finish the Augmentin  Please remember to go to the  x-ray department        11/10/2014  Acute ov/Amador Braddy re: bronchiectasis flare maint on zmax 500 tiw and dulera 100 2bid/ singulair  Chief Complaint  Patient presents with  . Acute Visit    C/o prod cough (thick yellow phlem), chest tx, increase SOB   acutely worse x one week assoc with nasal congesiton/ ear pan on L / doe bu nto at rest/ only using saba rarely despite flare/ comfortable at  rest rec Augmentin 875 mg take one pill twice daily  X 10 days.  Set up follow with ent > [facility]        01/14/2016  f/u ov/Kyleigha Markert re: bronchiectasis/ maint rx dulera 100 2 bid/ zmax  Chief Complaint  Patient presents with  . Acute Visit    Increased cough at night for the past month-yellow sputum. He also c/o waking up with chest tightness and SOB in the am- goes away after approx 1 hour and then comes back in the evening.   symptoms worse each am but don't wake him up    Continues with lots on nasal/ ear/ sinus congestion f/u at [facility] but no improvement to date  rec Pepcid 20 mg ac at bedtime x 6 weeks  GERD diet  Prednisone 10 mg take  4 each am x 2 days,   2 each am x 2 days,  1 each am x 2 days and stop  Use the flutter valve as much as you can  Plan A = Automatic =  Dulera 100 Take 2 puffs first thing in am and then another 2 puffs about 12 hours later.  Plan B = Backup Only use your albuterol as a rescue medication  Please schedule a follow up office visit in 6 weeks, call sooner if needed  - bring inhalers with you  Late add:  levaquin  750 x 5  days as has new area on L and needs f/u cxr on return    02/08/16 : Augmentin 875 mg take one pill twice daily  X 10 days - take at breakfast and supper with large glass of water.  It would help reduce the usual side effects (diarrhea and yeast infections) if you ate cultured yogurt at lunch. Then ov 2 weeks Prednisone 10 mg take  4 each am x 2 days,   2 each am x 2 days,  1 each am x 2 days and stop     02/21/2016  f/u ov/Vergie Zahm re: bronchiectasis f/u ? Pna  Chief Complaint  Patient presents with  . Follow-up    Needs CXR today. Pt states that breathing is okay. Still coughing, sputum bright yellow. Cough in morning has lessened but [place] still present. Pt states that his breathing is not back at baseline.     Feels levaquin usually works the best but needs longer rx/ augmentin not as effective/ still has unresolved sinus dz per [facility]  ent rec When needed > levaquin 500 mg daily x 10 days       09/01/2016  f/u ov/Nikoletta Varma re: bronchiectasis, mild flare  Chief Complaint  Patient presents with  . Follow-up    Pt c/o sinus pressure/HA, thick sputum, and chest tightness for the past wk. He started round of levaquin on 4/13.    f[facility]u at [facility] due now and has lots of nasal symptoms  Not limited by breathing from desired activities   rec Follow up with your E[facility] docs at [facility] Any time you have a cough flare for any reason:  Pantoprazole (protonix) 40 mg   Take  30-60 min before first meal of the day and Pepcid (famotidine)  20 mg one @  bedtime until back to normal    12/01/2016  f/u ov/Chase Knebel re: bronchiectasis  Regular f/u on symb 80 2bid/ singuliar/ zmax daily and no need for levaquin Chief Complaint  Patient presents with  . Follow-up    Pt states overall doing well. He rarely uses his albuterol inahaler.    Discouraged by ent > nothing much else to offer and continues to have nasal obst symptoms  rec Plan A = Automatic = symbiocrt 160 (or dulera 200) Take 2 puffs first thing in am and then another 2 puffs about 12 hours later.  Plan B = Backup Only use your albuterol as a rescue medication     03/03/2017  f/u ov/Nishaan Stanke re: obst bronchiectasis maint on symb 160 2bid  Chief Complaint  Patient presents with  . Follow-up    PFT's done today.  Breathing is overall doing well.  He has had some hoarseness and tickle in his throat for the past month or so. He is coughing up some sputum- unsure of color. He rarely uses his proair.   tickle in throat /globus sensation worse day than noct > says when finally coughs mucus out it goes away but the amt is minimal/ minimally yellow ? Some better p last round of prn levaquin  Am mucus minimal  Exercise fine = outdoor bike/ some running  Last levaquin one month prior to OV helped the tickle    No obvious day to day or daytime variability or assoc excess/ purulent sputum or mucus plugs or  hemoptysis or cp or chest tightness, subjective wheeze or overt sinus or hb symptoms. No unusual exp hx or h/o childhood pna/ asthma or knowledge of premature birth.  Sleeping ok flat without nocturnal  or early am exacerbation  of respiratory  c/o's or need for noct saba. Also denies any obvious fluctuation of symptoms with weather or environmental changes or other aggravating or alleviating factors except as outlined above   Current Allergies, Complete Past Medical History, Past Surgical History, Family History, and Social History were reviewed in [employer] record.  ROS  The following are not active complaints unless bolded Hoarseness, sore throat, dysphagia, dental problems, itching, sneezing,  nasal congestion or discharge of excess mucus or purulent secretions, ear ache,   fever, chills, sweats, unintended wt loss or wt gain, classically pleuritic or exertional cp,  orthopnea pnd or leg swelling, presyncope, palpitations, abdominal pain, anorexia, nausea, vomiting, diarrhea  or change in bowel habits or change in bladder habits, change in stools or change in urine, dysuria, hematuria,  rash, arthralgias, visual complaints, headache, numbness, weakness or ataxia or problems with walking or coordination,  change in mood/affect or memory.        Current Meds  Medication Sig  . albuterol (PROAIR HFA) 108 (90 Base) MCG/ACT inhaler Inhale 2 puffs into the lungs every 6 (six) hours as needed.  Marland Kitchen azithromycin (ZITHROMAX) 500 MG tablet Take 500 mg by mouth 3 (three) times a week.  . budesonide-formoterol (SYMBICORT) 160-4.5 MCG/ACT inhaler Inhale 2 puffs into the lungs 2 (two) times daily.  . famotidine (PEPCID) 20 MG tablet One at bedtime  . fluticasone (FLONASE) 50 MCG/ACT nasal spray Place 2 sprays into both nostrils daily.  Marland Kitchen guaiFENesin (MUCINEX) 600 MG 12 hr tablet Take 600 mg by mouth daily.  Marland Kitchen levocetirizine (XYZAL) 5 MG tablet Take 5 mg by mouth every evening.  .  montelukast (SINGULAIR) 10 MG tablet TAKE 1 TABLET (10 MG TOTAL) BY MOUTH DAILY.  . pantoprazole (PROTONIX) 40 MG tablet Take 1 tablet (40 mg total) by mouth daily. Take 30-60 min before first meal of the day  . Pseudoephedrine-Guaifenesin (MUCINEX D PO) Take 1 tablet by mouth at bedtime as needed.   Marland Kitchen Respiratory Therapy Supplies (FLUTTER) DEVI Use as directed.  . SUMAtriptan Succinate (IMITREX PO) As directed when needed                     Objective:   Physical Exam  amb wm nad / vital signs reviewed  - note sats 95% RA on arrival   06/30/2013      166>  08/05/13  168 > 09/21/2013 159> 02/17/2014  165 > 03/20/2014 160> 06/27/2014 152  > 11/10/2014  152 > 01/14/2016   166  > 02/21/2016 > 06/03/2016  167 > 09/01/2016   166  >  12/01/2016    162 > 03/03/2017   96%     06/16/13 170 lb 12.8 oz (77.474 kg)  02/02/13 168 lb 3.2 oz (76.295 kg)  01/18/13 165 lb (74.844 kg)        HEENT: nl dentition,  and nl oropharynx  - nasal tone to voice .    Mod non-specific  turbinate swelling bilaterally       NECK :  without JVD/Nodes/TM/ nl carotid upstrokes bilaterally   LUNGS: no acc muscle use,  Moderately coarse insp and exp rhonchi bilaterally   CV:  RRR  no s3 or murmur or increase in P2, no edema   ABD:  soft and nontender with nl excursion in the supine position. No bruits or organomegaly, bowel sounds nl  MS:  warm without deformities, calf tenderness,  cyanosis or clubbing      CXR PA and Lateral:   02/21/2016 :    I personally reviewed images and agree with radiology impression as follows:   Chronic bronchitic changes and atelectasis versus scarring LEFT lower lobe.        Assessment & Plan:

## 2017-03-03 NOTE — Patient Instructions (Signed)
No change in recs   See your ent doc if throat continues to bother you   Please schedule a follow up visit in 3 months but call sooner if needed

## 2017-03-04 NOTE — Assessment & Plan Note (Signed)
-   Flutter valve 02/02/13  - Prevnar given 06/16/2013  - started dulera 100 2bid 06/16/2013 > -  Alpha one AT 06/30/2013 >  MM - Sinus CT rec 06/30/2013 >  Diffuse chronic sinusitis changes  - PFT's 08/05/2013  [facility]  - resumed tiw zmax 500 09/29/13 > d/c ? When by pt > resumed 03/20/14 - levaquin 500 x 10 days prn flare rec 02/21/2016  Spirometry 12/01/2016  FEV1 2.26 (60%)  Ratio 61 on symb 80 2 bid  - 12/01/2016  After extensive coaching HFA effectiveness =    90% > try symb 160 2bid (or dulera 200)   - PFT's  03/03/2017  FEV1 2.72  (73 % ) ratio 65   p 3 % improvement from saba p symb 160  prior to study with DLCO  104/99 % corrects to 117  % for alv volume    He has done very well on the Symbicort 160 with   evidence of only mild residual airflow obstruction in a patient who clearly has chronic airway scarring that is not likely to respond to bronchodilators or inhaled steroids.  I doubt that the throat tickle is due to irritation from the Symbicort since it does improve on antibiotics and more likely is related to postnasal drip syndrome from chronic sinusitis for which I referred him back to his ENT physicians at [facility].   I had an extended discussion with the patient reviewing all relevant studies completed to date and  lasting 15 to 20 minutes of a 25 minute visit    Each maintenance medication was reviewed in detail including most importantly the difference between maintenance and prns and under what circumstances the prns are to be triggered using an action plan format that is not reflected in the computer generated alphabetically organized AVS.    Please see AVS for specific instructions unique to this visit that I personally wrote and verbalized to the the pt in detail and then reviewed with pt  by my nurse highlighting any  changes in therapy recommended at today's visit to their plan of care.

## 2017-03-27 ENCOUNTER — Other Ambulatory Visit: Payer: Self-pay | Admitting: Internal Medicine

## 2017-03-27 DIAGNOSIS — R059 Cough, unspecified: Secondary | ICD-10-CM

## 2017-03-27 DIAGNOSIS — R05 Cough: Secondary | ICD-10-CM

## 2017-04-13 ENCOUNTER — Other Ambulatory Visit: Payer: Self-pay | Admitting: Internal Medicine

## 2017-04-17 ENCOUNTER — Other Ambulatory Visit: Payer: Self-pay | Admitting: Internal Medicine

## 2017-04-17 DIAGNOSIS — J479 Bronchiectasis, uncomplicated: Secondary | ICD-10-CM

## 2017-06-01 ENCOUNTER — Telehealth: Payer: Self-pay | Admitting: Internal Medicine

## 2017-06-01 NOTE — Telephone Encounter (Signed)
Pt scheduled for OV with MW on Thursday for bronchiectasis flare, wants to know if he should keep the appt since he is starting an abx today.  I advised pt to give abx at least 24 hours to see if he notices an improvement before deciding whether or not to cancel appt.  I advised that if pt has any doubts to keep appt.  Pt expressed understanding.  Nothing further needed at this time.

## 2017-06-03 ENCOUNTER — Telehealth: Payer: Self-pay | Admitting: Internal Medicine

## 2017-06-03 NOTE — Telephone Encounter (Signed)
Called and left detailed message on pt's machine to call back to reschedule appt. Nothing further needed at this time. Will sign off.

## 2017-06-04 ENCOUNTER — Ambulatory Visit: Payer: Managed Care, Other (non HMO) | Admitting: Internal Medicine

## 2017-06-08 ENCOUNTER — Ambulatory Visit (INDEPENDENT_AMBULATORY_CARE_PROVIDER_SITE_OTHER): Payer: Managed Care, Other (non HMO) | Admitting: Acute Care

## 2017-06-08 ENCOUNTER — Telehealth: Payer: Self-pay | Admitting: Internal Medicine

## 2017-06-08 ENCOUNTER — Ambulatory Visit (INDEPENDENT_AMBULATORY_CARE_PROVIDER_SITE_OTHER)
Admission: RE | Admit: 2017-06-08 | Discharge: 2017-06-08 | Disposition: A | Discharge status: Home / Self Care | Payer: Managed Care, Other (non HMO) | Source: Ambulatory Visit | Attending: Acute Care | Admitting: Acute Care

## 2017-06-08 ENCOUNTER — Encounter: Payer: Self-pay | Admitting: Acute Care

## 2017-06-08 ENCOUNTER — Ambulatory Visit: Payer: Managed Care, Other (non HMO) | Admitting: Adult Health

## 2017-06-08 VITALS — BP 144/80 | HR 71 | Ht 68.0 in | Wt 165.8 lb

## 2017-06-08 DIAGNOSIS — R05 Cough: Secondary | ICD-10-CM | POA: Diagnosis not present

## 2017-06-08 DIAGNOSIS — R059 Cough, unspecified: Secondary | ICD-10-CM

## 2017-06-08 DIAGNOSIS — J471 Bronchiectasis with (acute) exacerbation: Secondary | ICD-10-CM

## 2017-06-08 DIAGNOSIS — J479 Bronchiectasis, uncomplicated: Secondary | ICD-10-CM

## 2017-06-08 MED ORDER — ALBUTEROL SULFATE HFA 108 (90 BASE) MCG/ACT IN AERS: 2.0000 | INHALATION_SPRAY | Freq: Four times a day (QID) | RESPIRATORY_TRACT | 3 refills | Status: AC | PRN

## 2017-06-08 MED ORDER — HYDROCODONE-HOMATROPINE 5-1.5 MG/5ML PO SYRP: 5.0000 mL | ORAL_SOLUTION | Freq: Four times a day (QID) | ORAL | 0 refills | Status: DC | PRN

## 2017-06-08 MED ORDER — PREDNISONE 10 MG PO TABS: ORAL_TABLET | ORAL | 0 refills | Status: DC

## 2017-06-08 NOTE — Assessment & Plan Note (Signed)
Acute visit to r/o flu Children have swab confirmed flu Plan: We will flu test you today>> negative CXR today>> Chronic LLL interstitial changes We will call you with results. Continue Levaquin until gone. Resume Zithromax once you have completed Levaquin Prednisone taper; 10 mg tablets: 4 tabs x 2 days, 3 tabs x 2 days, 2 tabs x 2 days 1 tab x 2 days then stop. Hydromet cough syrup 5 cc's three times daily Do not drive if sleepy Delsym every 12 hours  Sips of water instead of throat clearing. Sugar free jolly ranchers / Werther for throat soothing. Continue xyzol and sinus rinses as you have been doing Continue Symbicort 2 puffs twice daily Rinse after use. Follow up prn Please contact office for sooner follow up if symptoms do not improve or worsen or seek emergency care

## 2017-06-08 NOTE — Assessment & Plan Note (Signed)
Continue Symbicort 2 puffs twice daily Rinse mouth after use Continue Albuterol as needed for breakthrough dyspnea Follow up as needed Please contact office for sooner follow up if symptoms do not improve or worsen or seek emergency care

## 2017-06-08 NOTE — Patient Instructions (Addendum)
It is nice to meet you today. We will flu test you today>> negative CXR today We will call you with results. Continue Levaquin until gone. Resume Zithromax once you have completed Levaquin Prednisone taper; 10 mg tablets: 4 tabs x 2 days, 3 tabs x 2 days, 2 tabs x 2 days 1 tab x 2 days then stop. Hydromet cough syrup 5 cc's three times daily Do not drive if sleepy Delsym every 12 hours  Sips of water instead of throat clearing. Sugar free jolly ranchers / Werther for throat soothing. Continue xyzol and sinus rinses as you have been doing Continue Symbicort 2 puffs twice daily Rinse after use. Follow up prn Please contact office for sooner follow up if symptoms do not improve or worsen or seek emergency care

## 2017-06-08 NOTE — Telephone Encounter (Signed)
Pt has been scheduled with TP at 11:00a today. Pt is aware and voiced his understanding. Nothing further is needed.

## 2017-06-08 NOTE — Telephone Encounter (Signed)
Spoke with patient regarding not feeling well over the weekend. Pt is not sure if he's having bronchiectasis flare up or flu Both of pt's sons diagnosed with flu last week Pt reports fever since Saturday morning 06/06/17, prod cough-yellow, body aches, chills, headaches. Pt used ibuprofen for fever, no fever since last night.  Pt is currently taking Levaquin, has 2 days left on antibiotic  Pt would like to know if he is needing to be on anything else at this time, and is it a flare up or flu Pt would like refill on albuterol inhaler as it has expired. Pt requests message be sent to MW today; denied appt at this time.  MW please advise

## 2017-06-08 NOTE — Telephone Encounter (Signed)
Since just finishing levaquin could well be the flu  See if we can get him into [facility] today if not needs to go to [facility] for flu test

## 2017-06-08 NOTE — Progress Notes (Signed)
History of Present Illness Chad Osborne is a 47 y.o. male never smoker  with obstruction bronchiectasis followed by Dr. Melvyn Novas.  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  Maintenance: Symbicort 160 Albuterol for breakthrough Zithromax three times weekly for bronchiectasis  06/08/2017 Acute OV  Pt. Presents for an acute OV for what he suspects is flu. He states all 3 of his children have the flu  With + flu tests. He states he was at baseline until 06/01/2017 when he thought he was just having a bronchiectasis flare. He was treated with Levaquin and improved. He still has 2 Levaquin left to take.Then this past Saturday evening 06/06/2017 he got achy. He treated body aches and fever with Advil ( T Max 102 Saturday pm) Sunday fever resolved, patient has been resting and her spiked a temp to 101. He states his fever broke and he feels better today. He has a bad cough which is what is his primary concern today., Headache and muscle aches are better, secretions are clear.No sore throat. He denies chest pain, orthopnea or hemoptysis. He states he is compliant with Symbicort, Albuterol prn,  his flutter valve and sinus rinses.   Test Results:  06/08/2017>> Flu Swab negative   CXR 06/08/2017>> IMPRESSION: Chronic left lower lobe interstitial changes.  No acute disease.   - Flutter valve 02/02/13  - Prevnar given 06/16/2013  - started dulera 100 2bid 06/16/2013 > -  Alpha one AT 06/30/2013 >  MM - Sinus CT rec 06/30/2013 >  Diffuse chronic sinusitis changes  - PFT's 08/05/2013  [facility]  - resumed tiw zmax 500 09/29/13 > d/c ? When by pt > resumed 03/20/14 - levaquin 500 x 10 days prn flare rec 02/21/2016  Spirometry  12/01/2016  FEV1 2.26 (60%)  Ratio 61 on symb 80 2 bid  - 12/01/2016  After extensive coaching HFA effectiveness =    90% > try symb 160 2bid (or dulera 200)   - PFT's  03/03/2017  FEV1 2.72  (73 % ) ratio 65   p 3 % improvement from saba p symb 160  prior to study with DLCO  104/99 % corrects to 117  % for alv volume   He has done very well on the Symbicort 160 with   evidence of only mild residual airflow obstruction in a patient who clearly has chronic airway scarring that is not likely to respond to bronchodilators or inhaled steroids.    PFT    Component Value Date/Time   FEV1PRE 2.64 03/03/2017 1006   FEV1POST 2.72 03/03/2017 1006   FVCPRE 4.15 03/03/2017 1006   FVCPOST 4.17 03/03/2017 1006   TLC 6.70 03/03/2017 1006   DLCOUNC 29.69 03/03/2017 1006   PREFEV1FVCRT 64 03/03/2017 1006   PSTFEV1FVCRT 65 03/03/2017 1006     Past medical hx Past Medical History:  Diagnosis Date  . Bronchiectasis ([place])   . Testicle cancer [facility])      Social History   Tobacco Use  . Smoking status: Never Smoker  . Smokeless tobacco: Never Used  Substance Use Topics  . Alcohol use: Yes    Comment: socially  . Drug use: No    Mr.Rallis reports that  has never smoked.  he has never used smokeless tobacco. He reports that he drinks alcohol. He reports that he does not use drugs.  Tobacco Cessation: Never Smoker  Past surgical hx, Family hx, Social hx all reviewed.  Current Outpatient Medications on File Prior to Visit  Medication Sig  . azithromycin (ZITHROMAX) 500 MG tablet Take 500 mg by mouth 3 (three) times a week.  . budesonide-formoterol (SYMBICORT) 160-4.5 MCG/ACT inhaler Inhale 2 puffs into the lungs 2 (two) times daily.  . famotidine (PEPCID) 20 MG tablet TAKE 1 TABLET BY MOUTH AT BEDTIME  . fluticasone (FLONASE) 50 MCG/ACT nasal spray Place 2 sprays into both nostrils daily.  Marland Kitchen guaiFENesin (MUCINEX) 600 MG 12 hr tablet Take 600 mg by mouth daily.  Marland Kitchen levocetirizine (XYZAL) 5 MG  tablet Take 5 mg by mouth every evening.  Marland Kitchen levofloxacin (LEVAQUIN) 500 MG tablet TAKE 1 TABLET (500 MG TOTAL) BY MOUTH DAILY.  . montelukast (SINGULAIR) 10 MG tablet TAKE 1 TABLET (10 MG TOTAL) BY MOUTH DAILY.  . pantoprazole (PROTONIX) 40 MG tablet Take 1 tablet (40 mg total) by mouth daily. Take 30-60 min before first meal of the day  . Pseudoephedrine-Guaifenesin (MUCINEX D PO) Take 1 tablet by mouth at bedtime as needed.   Marland Kitchen Respiratory Therapy Supplies (FLUTTER) DEVI Use as directed.  . SUMAtriptan Succinate (IMITREX PO) As directed when needed   No current facility-administered medications on file prior to visit.      No Known Allergies  Review Of Systems:  Constitutional:   No  weight loss, night sweats, +  Fevers, chills ( Resolved 1/19), + fatigue, or  lassitude.  HEENT:   + headaches ( Resolved 1/19) ,  No Difficulty swallowing,  Tooth/dental problems, or  Sore throat,                No sneezing, itching, ear ache, nasal congestion, post nasal drip,   CV:  No chest pain,  Orthopnea, PND, swelling in lower extremities, anasarca, dizziness, palpitations, syncope.   GI  No heartburn, indigestion, abdominal pain, nausea, vomiting, diarrhea, change in bowel habits, loss of appetite, bloody stools.   Resp: Baseline  shortness of breath with exertion none  at rest.  + Baseline  excess mucus, + productive cough,  No non-productive cough,  No coughing up of blood.  No change in color of mucus.  No wheezing.  No chest wall deformity  Skin: no rash or lesions.  GU: no dysuria, change in color of urine, no urgency or frequency.  No flank pain, no hematuria   MS:  No joint pain or swelling.  No decreased range of motion.  + back pain with fever.  Psych:  No change in mood or affect. No depression or anxiety.  No memory loss.   Vital Signs BP (!) 144/80 (BP Location: Left Arm, Cuff Size: Normal)   Pulse 71   Ht 5\' 8"  (1.727 m)   Wt 165 lb 12.8 oz (75.2 kg)   SpO2 94%   BMI 25.21  kg/m    Physical Exam:  General- No distress,  A&Ox3, pleasant ENT: No sinus tenderness, TM clear, pale nasal mucosa, no oral exudate,no post nasal drip, no LAN Cardiac: S1, S2, regular rate and rhythm, no murmur Chest: No wheeze/ rales/ dullness; no accessory muscle use, no nasal flaring, no sternal retractions Abd.: Soft Non-tender, non-distended Ext: No clubbing cyanosis, edema Neuro:  normal strength Skin: No rashes, warm and dry, Clean dry and intact Psych: normal mood and behavior  Assessment/Plan  Cough Acute visit to r/o flu Children have swab confirmed flu Plan: We will flu test you today>> negative CXR today>> Chronic LLL interstitial changes We will call you with results. Continue Levaquin until gone. Resume Zithromax once you have completed Levaquin Prednisone taper; 10 mg tablets: 4 tabs x 2 days, 3 tabs x 2 days, 2 tabs x 2 days 1 tab x 2 days then stop. Hydromet cough syrup 5 cc's three times daily Do not drive if sleepy Delsym every 12 hours  Sips of water instead of throat clearing. Sugar free jolly ranchers / Werther for throat soothing. Continue xyzol and sinus rinses as you have been doing Continue Symbicort 2 puffs twice daily Rinse after use. Follow up prn Please contact office for sooner follow up if symptoms do not improve or worsen or seek emergency care   Bronchiectasis with acute exacerbation ([place]) ? Flare with viral trigger Plan: We will flu test you today>> negative CXR today We will call you with results. Continue Levaquin until gone. Resume Zithromax once you have completed Levaquin Prednisone taper; 10 mg tablets: 4 tabs x 2 days, 3 tabs x 2 days, 2 tabs x 2 days 1 tab x 2 days then stop. Hydromet cough syrup 5 cc's three times daily Do not drive if sleepy Delsym every 12 hours  Sips of water instead of throat clearing. Sugar free jolly ranchers / Werther for throat soothing. Continue xyzol and sinus rinses as you have been  doing Continue Symbicort 2 puffs twice daily Rinse after use. Follow up prn Please contact office for sooner follow up if symptoms do not improve or worsen or seek emergency care   Obstructive bronchiectasis (H[facility]) Continue Symbicort 2 puffs twice daily Rinse mouth after use Continue Albuterol as needed for breakthrough dyspnea Follow up as needed Please contact office for sooner follow up if symptoms do not improve or worsen or seek emergency care   Influenza Swab negative  Magdalen Spatz, NP 06/08/2017  2:24 PM

## 2017-06-08 NOTE — Assessment & Plan Note (Signed)
?   Flare with viral trigger Plan: We will flu test you today>> negative CXR today We will call you with results. Continue Levaquin until gone. Resume Zithromax once you have completed Levaquin Prednisone taper; 10 mg tablets: 4 tabs x 2 days, 3 tabs x 2 days, 2 tabs x 2 days 1 tab x 2 days then stop. Hydromet cough syrup 5 cc's three times daily Do not drive if sleepy Delsym every 12 hours  Sips of water instead of throat clearing. Sugar free jolly ranchers / [place]ther for throat soothing. Continue xyzol and sinus rinses as you have been doing Continue Symbicort 2 puffs twice daily Rinse after use. Follow up prn Please contact office for sooner follow up if symptoms do not improve or worsen or seek emergency care

## 2017-09-17 ENCOUNTER — Encounter: Payer: Self-pay | Admitting: Adult Health

## 2017-09-17 ENCOUNTER — Ambulatory Visit (INDEPENDENT_AMBULATORY_CARE_PROVIDER_SITE_OTHER): Payer: Managed Care, Other (non HMO) | Admitting: Adult Health

## 2017-09-17 DIAGNOSIS — J479 Bronchiectasis, uncomplicated: Secondary | ICD-10-CM | POA: Diagnosis not present

## 2017-09-17 DIAGNOSIS — B3789 Other sites of candidiasis: Secondary | ICD-10-CM | POA: Diagnosis not present

## 2017-09-17 MED ORDER — BUDESONIDE-FORMOTEROL FUMARATE 80-4.5 MCG/ACT IN AERO: 2.0000 | INHALATION_SPRAY | Freq: Two times a day (BID) | RESPIRATORY_TRACT | 1 refills | Status: AC

## 2017-09-17 MED ORDER — FLUCONAZOLE 100 MG PO TABS: 100.0000 mg | ORAL_TABLET | Freq: Every day | ORAL | 0 refills | Status: AC

## 2017-09-17 NOTE — Patient Instructions (Signed)
Decrease Symbicort 80 2 puffs Twice daily  , brush, rinse and gargle after use.  Good oral care.  Diflucan 100mg  daily for 10 days .  Yogurt and Probiotic as discussed.  Follow up with Dr. Melvyn Novas  In 4 weeks and As needed   Please contact office for sooner follow up if symptoms do not improve or worsen or seek emergency care

## 2017-09-17 NOTE — Assessment & Plan Note (Signed)
patient is on inhaled corticosteroids, nasal corticosteroids, chronic antibiotic suppression and frequent courses of oral steroids.  This predisposes him for recurrent throat infections of Candida. Encouraged on good oral care with his inhalers.  He is to use strict brushing and gargling after inhaler use.  May try daily yogurt and probiotics to see if this helps at all.  Will decrease his inhaled corticosteroid.  Plan  Patient Instructions  Decrease Symbicort 80 2 puffs Twice daily  , brush, rinse and gargle after use.  Good oral care.  Diflucan 100mg  daily for 10 days .  Yogurt and Probiotic as discussed.  Follow up with Dr. Melvyn Novas  In 4 weeks and As needed   Please contact office for sooner follow up if symptoms do not improve or worsen or seek emergency care

## 2017-09-17 NOTE — Progress Notes (Signed)
@Patient  ID: Chad Osborne, male    DOB: 25-Feb-1971, 47 y.o.   MRN: [id number]  Chief Complaint  Patient presents with  . Acute Visit    Hoarseness     Referring provider: No ref. provider found  HPI: 47 year old male never smoker followed for bronchiectasis and chronic airflow obstruction . Has Chronic sinusitis  Past medical history significant for testicular cancer 2004 status post orchiectomy with lung nodules presumed to be mets status post chemo.  Nodules resolved but then recurrent infections diagnosed with bronchiectasis by pulmonary in [place]  TEST  Alpha one AT 06/30/2013 >MM - Sinus CT rec 06/30/2013 >Diffuse chronic sinusitis changes  - PFT's 08/05/2013 wnl  - resumed tiw zmax 500 09/29/13 > d/c ? When by pt > resumed 03/20/14 - levaquin 500 x 10 days prn flare rec 02/21/2016  Spirometry 12/01/2016 FEV1 2.26 (60%) Ratio 61 on symb 80 2 bid    - PFT's 03/03/2017 FEV1 2.72 (73 % ) ratio 65 p 3 % improvement from saba p symb 160 prior to study with DLCO 104/99 % corrects to 117 % for alv volume    09/17/2017 Acute OV : Hoarseness  Pt presents for an acute office visit. Complains of recurrent hoarseness and voice changes over last 6-69months. Since changing from Symbicort 80 to 160 last year he has recurrent throat infections.  Seen by ENT with endoscopy , dx w/ vocal cord , post pharynx candida . Tx w/ Diflucan x 2 .  He says that when he was on the lower dose of Symbicort or Dulera he did not get this. Says does well for a while then sx come back . We discussed oral care for inhalers.  He is on symbicort, 3 d a wk azithromycin and budesonide nasal rinses .   Says overall that breathing has been doing well.  He has had no increased cough or congestion.  No wheezing or increased shortness of breath.  Tries to remain active.  He denies any fever, dysphagia, GERD, abdominal pain or nausea vomiting.  No Known Allergies  Immunization History  Administered Date(s)  Administered  . Influenza Split 02/16/2013  . Influenza Whole 02/17/2012, 02/16/2013  . Influenza,inj,Quad PF,6+ Mos 06/27/2014, 06/03/2016, 03/03/2017  . Influenza-Unspecified 02/16/2014  . Pneumococcal Conjugate-13 06/16/2013    Past Medical History:  Diagnosis Date  . Bronchiectasis ([place])   . Testicle cancer ([place])     Tobacco History: Social History   Tobacco Use  Smoking Status Never Smoker  Smokeless Tobacco Never Used   Counseling given: Not Answered   Outpatient Encounter Medications as of 09/17/2017  Medication Sig  . albuterol (PROAIR HFA) 108 (90 Base) MCG/ACT inhaler Inhale 2 puffs into the lungs every 6 (six) hours as needed.  Marland Kitchen azithromycin (ZITHROMAX) 500 MG tablet Take 500 mg by mouth 3 (three) times a week.  . budesonide-formoterol (SYMBICORT) 160-4.5 MCG/ACT inhaler Inhale 2 puffs into the lungs 2 (two) times daily.  . famotidine (PEPCID) 20 MG tablet TAKE 1 TABLET BY MOUTH AT BEDTIME  . fluticasone (FLONASE) 50 MCG/ACT nasal spray Place 2 sprays into both nostrils daily.  Marland Kitchen guaiFENesin (MUCINEX) 600 MG 12 hr tablet Take 600 mg by mouth daily.  Marland Kitchen levocetirizine (XYZAL) 5 MG tablet Take 5 mg by mouth every evening.  . montelukast (SINGULAIR) 10 MG tablet TAKE 1 TABLET (10 MG TOTAL) BY MOUTH DAILY.  . pantoprazole (PROTONIX) 40 MG tablet Take 1 tablet (40 mg total) by mouth daily. Take 30-60 min before first meal  of the day  . Pseudoephedrine-Guaifenesin (MUCINEX D PO) Take 1 tablet by mouth at bedtime as needed.   Marland Kitchen Respiratory Therapy Supplies (FLUTTER) DEVI Use as directed.  . SUMAtriptan Succinate (IMITREX PO) As directed when needed  . fluconazole (DIFLUCAN) 100 MG tablet Take 1 tablet (100 mg total) by mouth daily for 10 days.  Marland Kitchen HYDROcodone-homatropine (HYDROMET) 5-1.5 MG/5ML syrup Take 5 mLs by mouth every 6 (six) hours as needed for cough. (Patient not taking: Reported on 09/17/2017)  . levofloxacin (LEVAQUIN) 500 MG tablet TAKE 1 TABLET (500 MG TOTAL) BY  MOUTH DAILY. (Patient not taking: Reported on 09/17/2017)  . predniSONE (DELTASONE) 10 MG tablet Take 4 tabs for 2 days, then 3 tabs for 2 days, 2 tabs for 2 days, then 1 tab for 2 days, then stop. (Patient not taking: Reported on 09/17/2017)   No facility-administered encounter medications on file as of 09/17/2017.      Review of Systems  Constitutional:   No  weight loss, night sweats,  Fevers, chills, fatigue, or  lassitude.  HEENT:   No headaches,  Difficulty swallowing,  Tooth/dental problems, or  Sore throat,                No sneezing, itching, ear ache, nasal congestion, post nasal drip,  ++hoarseness   CV:  No chest pain,  Orthopnea, PND, swelling in lower extremities, anasarca, dizziness, palpitations, syncope.   GI  No heartburn, indigestion, abdominal pain, nausea, vomiting, diarrhea, change in bowel habits, loss of appetite, bloody stools.   Resp: No shortness of breath with exertion or at rest.  No excess mucus, no productive cough,  No non-productive cough,  No coughing up of blood.  No change in color of mucus.  No wheezing.  No chest wall deformity  Skin: no rash or lesions.  GU: no dysuria, change in color of urine, no urgency or frequency.  No flank pain, no hematuria   MS:  No joint pain or swelling.  No decreased range of motion.  No back pain.    Physical Exam  BP 114/72 (BP Location: Left Arm, Cuff Size: Normal)   Pulse 73   Temp 98.3 F (36.8 C) (Oral)   Ht 5\' 8"  (1.727 m)   Wt 165 lb 9.6 oz (75.1 kg)   SpO2 94%   BMI 25.18 kg/m   GEN: A/Ox3; pleasant , NAD, well nourished    HEENT:  [place]/AT,  EACs-clear, TMs-wnl, NOSE-clear, THROAT-clear, no lesions, no postnasal drip or exudate noted. No thrush noted. +hoarse voice   NECK:  Supple w/ fair ROM; no JVD; normal carotid impulses w/o bruits; no thyromegaly or nodules palpated; no lymphadenopathy.    RESP  Clear  P & A; w/o, wheezes/ rales/ or rhonchi. no accessory muscle use, no dullness to  percussion  CARD:  RRR, no m/r/g, no peripheral edema, pulses intact, no cyanosis or clubbing.  GI:   Soft & nt; nml bowel sounds; no organomegaly or masses detected.   Musco: Warm bil, no deformities or joint swelling noted.   Neuro: alert, no focal deficits noted.    Skin: Warm, no lesions or rashes    Lab Results:  CBC No results found for: WBC, RBC, HGB, HCT, PLT, MCV, MCH, MCHC, RDW, LYMPHSABS, MONOABS, EOSABS, BASOSABS  BMET No results found for: NA, K, CL, CO2, GLUCOSE, BUN, CREATININE, CALCIUM, GFRNONAA, GFRAA  BNP No results found for: BNP  ProBNP No results found for: PROBNP  Imaging: No results found.  Assessment & Plan:   Obstructive bronchiectasis ([place]) Appears compensated on present regimen We will try to decrease inhaled corticosteroid to help with recurrent pharynx and vocal cord Candida Good oral care with inhalers discussed.  Suggested using daily probiotics and yogurt  Plan  . Patient Instructions  Decrease Symbicort 80 2 puffs Twice daily  , brush, rinse and gargle after use.  Good oral care.  Diflucan 100mg  daily for 10 days .  Yogurt and Probiotic as discussed.  Follow up with Dr. Melvyn Novas  In 4 weeks and As needed   Please contact office for sooner follow up if symptoms do not improve or worsen or seek emergency care         Candida laryngitis  patient is on inhaled corticosteroids, nasal corticosteroids, chronic antibiotic suppression and frequent courses of oral steroids.  This predisposes him for recurrent throat infections of Candida. Encouraged on good oral care with his inhalers.  He is to use strict brushing and gargling after inhaler use.  May try daily yogurt and probiotics to see if this helps at all.  Will decrease his inhaled corticosteroid.  Plan  Patient Instructions  Decrease Symbicort 80 2 puffs Twice daily  , brush, rinse and gargle after use.  Good oral care.  Diflucan 100mg  daily for 10 days .  Yogurt and Probiotic  as discussed.  Follow up with Dr. Melvyn Novas  In 4 weeks and As needed   Please contact office for sooner follow up if symptoms do not improve or worsen or seek emergency care            Rexene Edison, NP 09/17/2017

## 2017-09-17 NOTE — Assessment & Plan Note (Signed)
Appears compensated on present regimen We will try to decrease inhaled corticosteroid to help with recurrent pharynx and vocal cord Candida Good oral care with inhalers discussed.  Suggested using daily probiotics and yogurt  Plan  . Patient Instructions  Decrease Symbicort 80 2 puffs Twice daily  , brush, rinse and gargle after use.  Good oral care.  Diflucan 100mg  daily for 10 days .  Yogurt and Probiotic as discussed.  Follow up with Dr. Melvyn Novas  In 4 weeks and As needed   Please contact office for sooner follow up if symptoms do not improve or worsen or seek emergency care

## 2017-09-17 NOTE — Progress Notes (Signed)
Chart and office note reviewed in detail  > agree with a/p as outlined    

## 2017-09-17 NOTE — Addendum Note (Signed)
Addended by: Parke Poisson E on: 09/17/2017 03:36 PM   Modules accepted: Orders

## 2017-10-16 ENCOUNTER — Ambulatory Visit (INDEPENDENT_AMBULATORY_CARE_PROVIDER_SITE_OTHER): Payer: Managed Care, Other (non HMO) | Admitting: Internal Medicine

## 2017-10-16 ENCOUNTER — Encounter: Payer: Self-pay | Admitting: Internal Medicine

## 2017-10-16 VITALS — BP 116/64 | HR 77 | Ht 67.0 in | Wt 164.0 lb

## 2017-10-16 DIAGNOSIS — J479 Bronchiectasis, uncomplicated: Secondary | ICD-10-CM

## 2017-10-16 DIAGNOSIS — B3789 Other sites of candidiasis: Secondary | ICD-10-CM

## 2017-10-16 NOTE — Assessment & Plan Note (Signed)
Resolved > precautions reviewed on how to best use symbicort / brush teeth with arm and hammer baking soda based toothpaste/ f/u with ent as planned

## 2017-10-16 NOTE — Progress Notes (Signed)
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   History of Present Illness  10/19/2012 1st pulmonary eval cc cough each am > minimal clear never bloody on zmax 500 tiw and pulmicort /xopenex bid and singulair and no limiting sob at time of initial ov. rec Continue nebulizer twice daily for now with budesonide and xopenex Stop saline for now Use proaire as needed if short of breath up to 4 hours but the less you use it the better it will work. Levaquin 750 x 5 days if needed if mucus gets nasty     09/17/17 NP ov re thrush Decrease Symbicort 80 2 puffs Twice daily  , brush, rinse and gargle after use.  Good oral care.  Diflucan 100mg  daily for 10 days .  Yogurt and Probiotic as discussed.      10/16/2017  f/u ov/Nehan Flaum re: obst bronchiectasis now on symb 80 2bid/ thrush resolved/ holding maint zmax while on clinda per ENT for pansinusitis  Chief Complaint  Patient presents with  . Follow-up    He states that he has been feeling SOB in the mornings at times. He is coughing with yellow sputum.  He uses the albuterol inhaler 1 x per wk at the most.   Dyspnea:  Ok, starting to do some ex/ only bothering him first thing in am and better p gets his symbicort x 2 pffs  Cough: worse first thing in am , clindamycin rx since 10/15/17 Sleep: fine  SABA use:  As above    No obvious day to day or daytime variability or assoc  mucus plugs or hemoptysis or cp or chest tightness, subjective wheeze or overt   hb symptoms. No unusual exposure hx or h/o childhood pna/ asthma or knowledge of premature birth.  Sleeping  Fine s elevated hob   without nocturnal  or early am exacerbation  of respiratory  c/o's or need for noct saba. Also denies any obvious fluctuation of symptoms with weather or environmental changes or other aggravating or alleviating factors except as outlined above   Current Allergies, Complete Past Medical History, Past Surgical History, Family History, and Social History were reviewed in [employer] record.  ROS  The following are not active complaints unless bolded Hoarseness, sore throat, dysphagia, dental problems, itching, sneezing,  nasal congestion or discharge of excess mucus or purulent secretions, ear ache,   fever, chills, sweats, unintended wt loss or wt gain, classically pleuritic or exertional cp,  orthopnea pnd or arm/hand swelling  or leg swelling, presyncope, palpitations, abdominal pain, anorexia, nausea, vomiting, diarrhea  or change in bowel habits or change in bladder habits, change in stools or change in urine, dysuria, hematuria,  rash, arthralgias, visual complaints, headache, numbness, weakness or ataxia or problems with walking or coordination,  change in mood or  memory.        Current Meds  Medication Sig  . albuterol (PROAIR HFA) 108 (90 Base) MCG/ACT inhaler Inhale 2 puffs into the lungs every 6 (six) hours as needed.  Marland Kitchen azithromycin (ZITHROMAX) 500 MG tablet Take 500 mg by mouth 3 (three) times  a week.  . budesonide-formoterol (SYMBICORT) 80-4.5 MCG/ACT inhaler Inhale 2 puffs into the lungs 2 (two) times daily.  . clindamycin (CLEOCIN) 300 MG capsule Take 1 capsule by mouth 3 (three) times daily.  . famotidine (PEPCID) 20 MG tablet TAKE 1 TABLET BY MOUTH AT BEDTIME  . fluticasone (FLONASE) 50 MCG/ACT nasal spray Place 2 sprays into both nostrils daily.  Marland Kitchen guaiFENesin (MUCINEX) 600 MG 12 hr tablet Take 600 mg by mouth daily.  Marland Kitchen levocetirizine (XYZAL) 5 MG tablet Take 5 mg by mouth every evening.  . montelukast (SINGULAIR) 10 MG tablet TAKE 1 TABLET (10 MG TOTAL) BY  MOUTH DAILY.  . pantoprazole (PROTONIX) 40 MG tablet Take 1 tablet (40 mg total) by mouth daily. Take 30-60 min before first meal of the day  . Pseudoephedrine-Guaifenesin (MUCINEX D PO) Take 1 tablet by mouth at bedtime as needed.   Marland Kitchen Respiratory Therapy Supplies (FLUTTER) DEVI Use as directed.  . SUMAtriptan Succinate (IMITREX PO) As directed when needed  .                      Objective:   Physical Exam   amb wm nad   Vital signs reviewed - Note on arrival 02 sats  96% on RA    06/30/2013      166>  08/05/13  168 > 09/21/2013 159> 02/17/2014  165 > 03/20/2014 160> 06/27/2014 152  > 11/10/2014  152 > 01/14/2016   166  > 02/21/2016 > 06/03/2016  167 > 09/01/2016   166  >  12/01/2016    162 > 03/03/2017   96% > 10/16/2017   164     06/16/13 170 lb 12.8 oz (77.474 kg)  02/02/13 168 lb 3.2 oz (76.295 kg)  01/18/13 165 lb (74.844 kg)        HEENT: nl dentition, and oropharynx s thrush. Nl external ear canals without cough reflex - moderate bilateral non-specific turbinate edema  With mp secretions bilaterally as well    NECK :  without JVD/Nodes/TM/ nl carotid upstrokes bilaterally   LUNGS: no acc muscle use,  Nl contour chest with insp and exp rhonchi    CV:  RRR  no s3 or murmur or increase in P2, and no edema   ABD:  soft and nontender with nl inspiratory excursion in the supine position. No bruits or organomegaly appreciated, bowel sounds nl  MS:  Nl gait/ ext warm without deformities, calf tenderness, cyanosis or clubbing No obvious joint restrictions   SKIN: warm and dry without lesions    NEURO:  alert, approp, nl sensorium with  no motor or cerebellar deficits apparent.            Assessment & Plan:

## 2017-10-16 NOTE — Patient Instructions (Signed)
No change medications    Please schedule a follow up visit in 6 months but call sooner if needed  

## 2017-10-16 NOTE — Assessment & Plan Note (Signed)
-   Flutter valve 02/02/13  - Prevnar given 06/16/2013  - started dulera 100 2bid 06/16/2013 > -  Alpha one AT 06/30/2013 >  MM - Sinus CT rec 06/30/2013 >  Diffuse chronic sinusitis changes  - PFT's 08/05/2013  wnl  - resumed tiw zmax 500 09/29/13 > d/c ? When by pt > resumed 03/20/14 - levaquin 500 x 10 days prn flare rec 02/21/2016  Spirometry 12/01/2016  FEV1 2.26 (60%)  Ratio 61 on symb 80 2 bid  - 12/01/2016  After extensive coaching HFA effectiveness =    90% > try symb 160 2bid (or dulera 200)   - PFT's  03/03/2017  FEV1 2.72  (73 % ) ratio 65   p 3 % improvement from saba p symb 160  prior to study with DLCO  104/99 % corrects to 117  % for alv volume     No real change on the lower strength of symbicort @! 80 2bid and no thrush so far so ok to continue same dose and work hard with his new ent to control sinus dz   Each maintenance medication was reviewed in detail including most importantly the difference between maintenance and as needed and under what circumstances the prns are to be used.  Please see AVS for specific  Instructions which are unique to this visit and I personally typed out  which were reviewed in detail in writing with the patient and a copy provided.

## 2018-01-06 ENCOUNTER — Other Ambulatory Visit: Payer: Self-pay | Admitting: Internal Medicine

## 2018-04-19 ENCOUNTER — Ambulatory Visit: Payer: Managed Care, Other (non HMO) | Admitting: Internal Medicine

## 2018-05-10 ENCOUNTER — Telehealth: Payer: Self-pay | Admitting: Internal Medicine

## 2018-05-10 DIAGNOSIS — J479 Bronchiectasis, uncomplicated: Secondary | ICD-10-CM

## 2018-05-10 MED ORDER — LEVOFLOXACIN 500 MG PO TABS: 500.0000 mg | ORAL_TABLET | Freq: Every day | ORAL | 11 refills | Status: AC

## 2018-05-10 NOTE — Telephone Encounter (Signed)
Ok to refill x 11

## 2018-05-10 NOTE — Telephone Encounter (Signed)
Call made to patient, states he needs a refill on Levaquin as he is having a bronchiectasis flare. He reports MW gives him a year refill, states he is having chest tightness and thick mucous with his baseline SOB.   MW please advise if okay to refill Levaquin, if so with how many refills. Thanks.

## 2018-05-10 NOTE — Telephone Encounter (Signed)
Spoke with pt. He is aware that MW is okay with refilling this medication for a year. Rx has been sent in. Nothing further was needed.

## 2019-05-18 IMAGING — DX DG CHEST 2V
2 series · 2 of 2 positions shown · non-contrast
Comparison: 02/21/2016

CLINICAL DATA: Cough, tightness s/p bronchiectasis flare up x 1
week; worsening. Hx of pna.

EXAM:
CHEST - 2 VIEW

[chest pa]
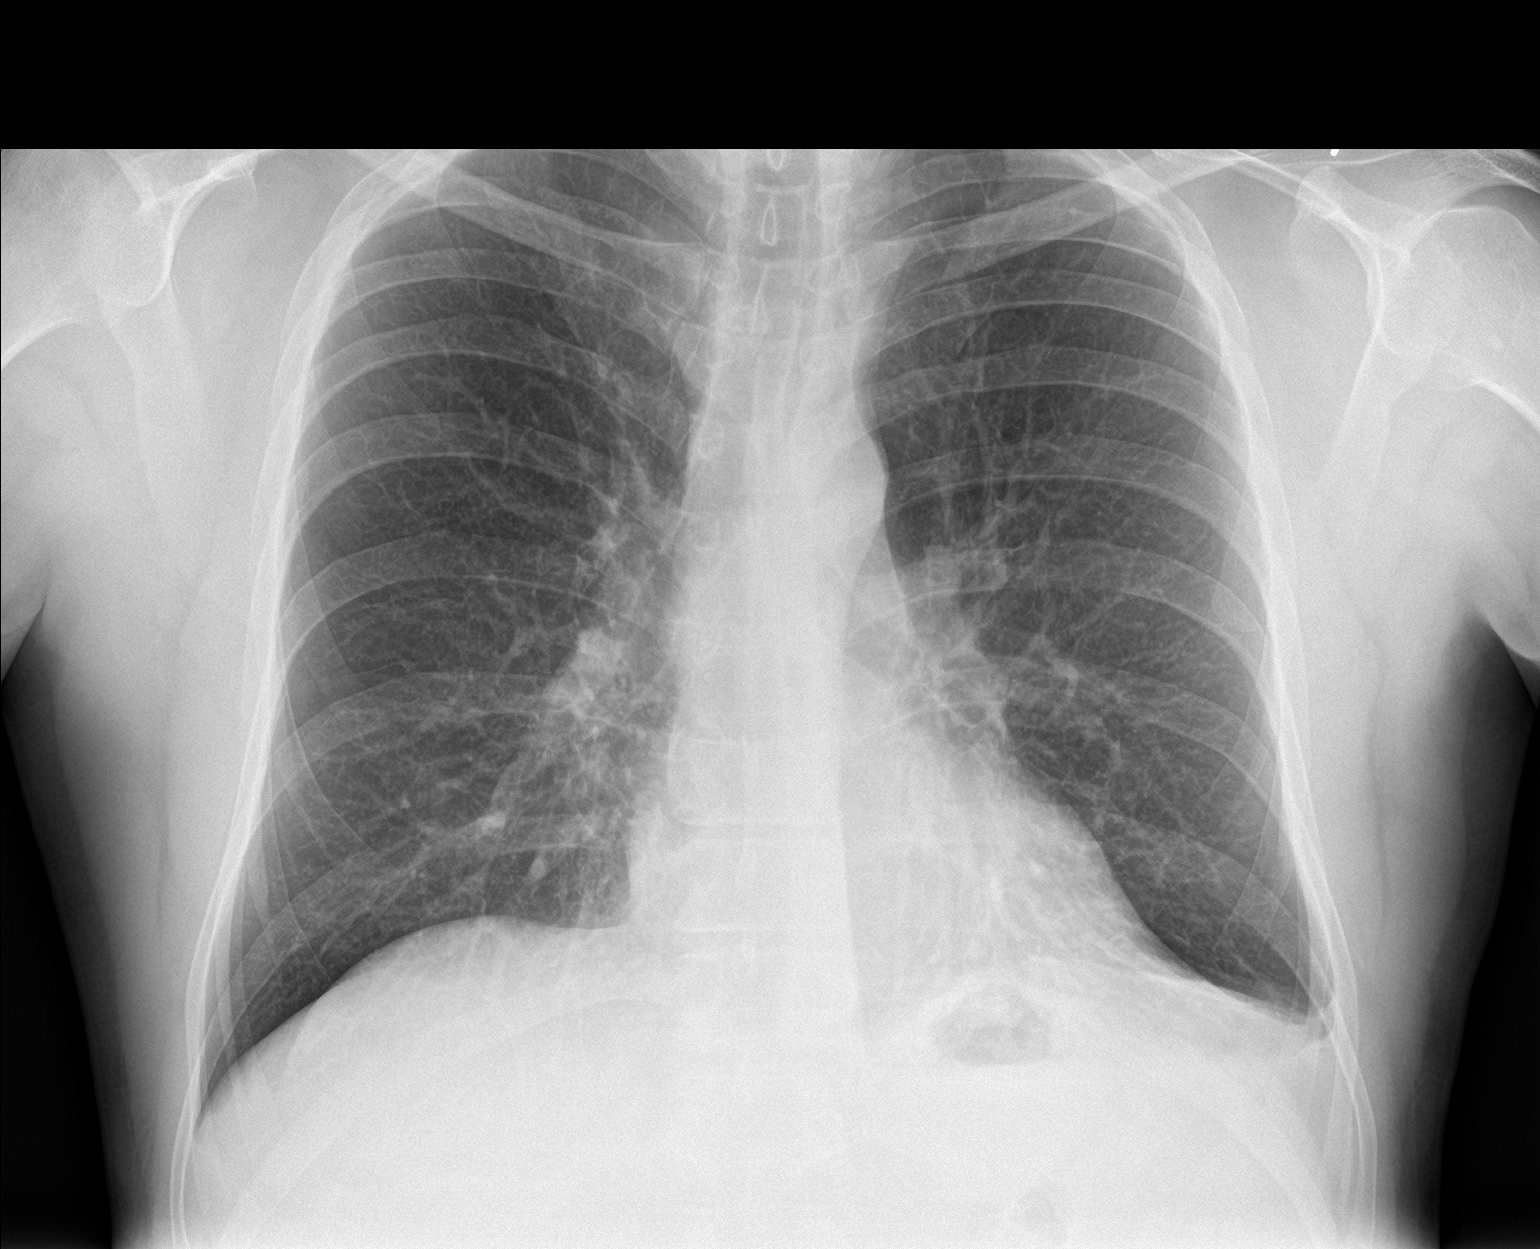

[chest lat]
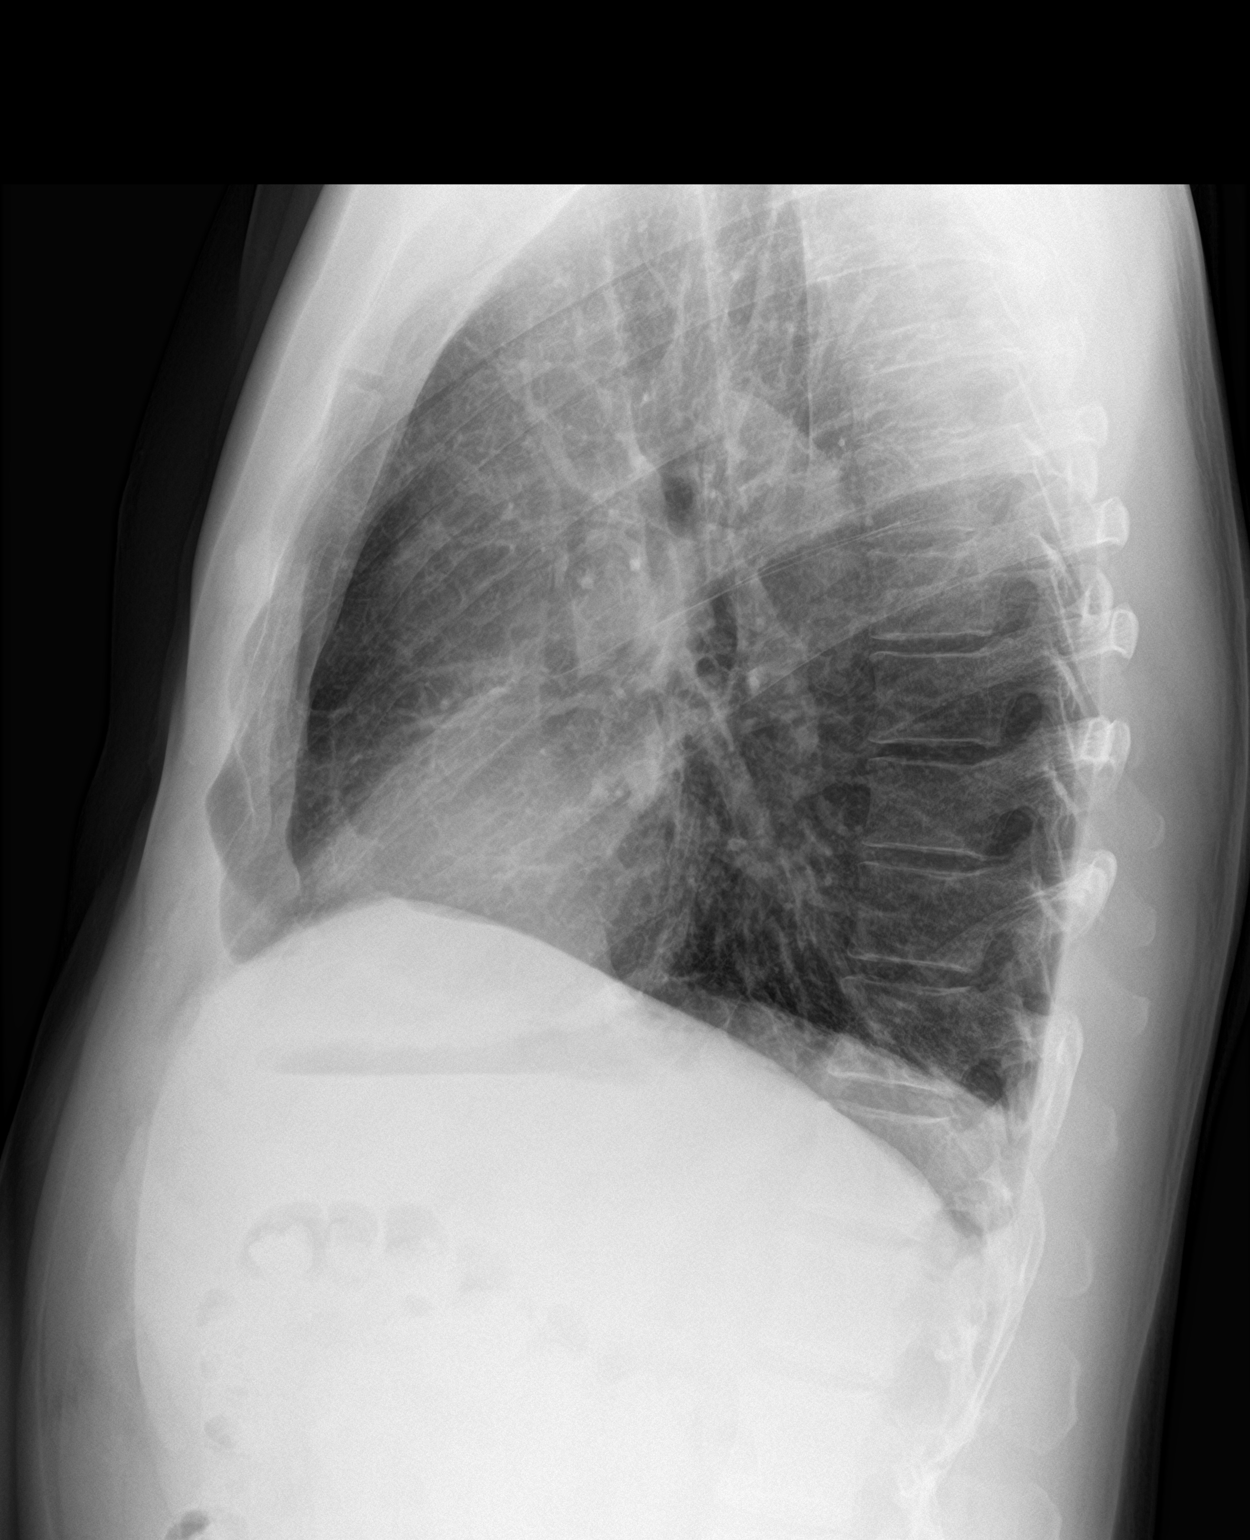

[2 of 2 positions shown; findings below may reference images not displayed]

FINDINGS: Coarse interstitial markings in the left lower lobe as before. No
confluent airspace disease or overt edema.

Heart size and mediastinal contours are within normal limits.

No effusion.

Visualized bones unremarkable.
IMPRESSION: Chronic left lower lobe interstitial changes.  No acute disease.
